# Patient Record
Sex: Female | Born: 1975
Health system: Southern US, Community
[De-identification: ages and names within clinical notes are randomized; demographics above are authoritative.]

## PROBLEM LIST (undated history)

## (undated) DIAGNOSIS — I10 Essential (primary) hypertension: Secondary | ICD-10-CM

## (undated) DIAGNOSIS — K219 Gastro-esophageal reflux disease without esophagitis: Secondary | ICD-10-CM

## (undated) DIAGNOSIS — M1990 Unspecified osteoarthritis, unspecified site: Secondary | ICD-10-CM

## (undated) HISTORY — PX: TUBAL LIGATION: SHX77

---
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
## 1999-07-04 ENCOUNTER — Encounter: Payer: Self-pay | Admitting: Family Medicine

## 1999-07-04 ENCOUNTER — Encounter: Admission: RE | Admit: 1999-07-04 | Discharge: 1999-07-04 | Payer: Self-pay | Admitting: Family Medicine

## 2000-11-04 ENCOUNTER — Ambulatory Visit (HOSPITAL_COMMUNITY): Admission: RE | Admit: 2000-11-04 | Discharge: 2000-11-04 | Payer: Self-pay | Admitting: Internal Medicine

## 2000-11-04 ENCOUNTER — Encounter: Payer: Self-pay | Admitting: Internal Medicine

## 2000-12-04 ENCOUNTER — Ambulatory Visit (HOSPITAL_COMMUNITY): Admission: RE | Admit: 2000-12-04 | Discharge: 2000-12-04 | Payer: Self-pay | Admitting: Internal Medicine

## 2000-12-04 ENCOUNTER — Encounter: Payer: Self-pay | Admitting: Internal Medicine

## 2005-06-16 ENCOUNTER — Inpatient Hospital Stay (HOSPITAL_COMMUNITY): Admission: AD | Admit: 2005-06-16 | Discharge: 2005-06-20 | Payer: Self-pay | Admitting: Obstetrics and Gynecology

## 2005-08-05 ENCOUNTER — Inpatient Hospital Stay (HOSPITAL_COMMUNITY): Admission: RE | Admit: 2005-08-05 | Discharge: 2005-08-08 | Payer: Self-pay | Admitting: Obstetrics and Gynecology

## 2005-10-26 ENCOUNTER — Emergency Department (HOSPITAL_COMMUNITY): Admission: EM | Admit: 2005-10-26 | Discharge: 2005-10-26 | Payer: Self-pay | Admitting: Emergency Medicine

## 2007-01-02 ENCOUNTER — Inpatient Hospital Stay (HOSPITAL_COMMUNITY): Admission: AD | Admit: 2007-01-02 | Discharge: 2007-01-02 | Payer: Self-pay | Admitting: Obstetrics and Gynecology

## 2007-01-14 ENCOUNTER — Inpatient Hospital Stay (HOSPITAL_COMMUNITY): Admission: AD | Admit: 2007-01-14 | Discharge: 2007-01-14 | Payer: Self-pay | Admitting: Obstetrics and Gynecology

## 2007-01-18 ENCOUNTER — Encounter (INDEPENDENT_AMBULATORY_CARE_PROVIDER_SITE_OTHER): Payer: Self-pay | Admitting: Obstetrics and Gynecology

## 2007-01-18 ENCOUNTER — Inpatient Hospital Stay (HOSPITAL_COMMUNITY): Admission: AD | Admit: 2007-01-18 | Discharge: 2007-01-22 | Payer: Self-pay | Admitting: Obstetrics and Gynecology

## 2007-03-01 HISTORY — PX: CHOLECYSTECTOMY: SHX55

## 2007-08-02 ENCOUNTER — Ambulatory Visit: Payer: Self-pay | Admitting: Cardiology

## 2007-08-10 ENCOUNTER — Ambulatory Visit: Payer: Self-pay | Admitting: Cardiology

## 2007-08-11 ENCOUNTER — Ambulatory Visit: Payer: Self-pay | Admitting: Cardiology

## 2008-04-23 ENCOUNTER — Emergency Department (HOSPITAL_COMMUNITY): Admission: EM | Admit: 2008-04-23 | Discharge: 2008-04-23 | Payer: Self-pay | Admitting: Emergency Medicine

## 2009-03-20 ENCOUNTER — Emergency Department (HOSPITAL_COMMUNITY): Admission: EM | Admit: 2009-03-20 | Discharge: 2009-03-20 | Payer: Self-pay | Admitting: Emergency Medicine

## 2009-12-28 ENCOUNTER — Observation Stay (HOSPITAL_COMMUNITY): Admission: AD | Admit: 2009-12-28 | Discharge: 2009-12-30 | Payer: Self-pay | Admitting: Obstetrics and Gynecology

## 2010-01-20 ENCOUNTER — Ambulatory Visit: Payer: Self-pay | Admitting: Nurse Practitioner

## 2010-01-20 ENCOUNTER — Inpatient Hospital Stay (HOSPITAL_COMMUNITY): Admission: AD | Admit: 2010-01-20 | Discharge: 2010-01-24 | Payer: Self-pay | Admitting: Obstetrics and Gynecology

## 2010-01-21 ENCOUNTER — Encounter (INDEPENDENT_AMBULATORY_CARE_PROVIDER_SITE_OTHER): Payer: Self-pay | Admitting: Obstetrics and Gynecology

## 2010-09-14 LAB — BASIC METABOLIC PANEL
CO2: 25 mEq/L (ref 19–32)
Calcium: 8.3 mg/dL — ABNORMAL LOW (ref 8.4–10.5)
Creatinine, Ser: 0.57 mg/dL (ref 0.4–1.2)
GFR calc Af Amer: >60 mL/min (ref >60)
Sodium: 137 mEq/L (ref 135–145)

## 2010-09-14 LAB — CBC
HCT: 30.7 % — ABNORMAL LOW (ref 36.0–46.0)
Hemoglobin: 10.1 g/dL — ABNORMAL LOW (ref 12.0–15.0)
Hemoglobin: 8.5 g/dL — ABNORMAL LOW (ref 12.0–15.0)
MCH: 26.5 pg (ref 26.0–34.0)
MCHC: 32.8 g/dL (ref 30.0–36.0)
MCV: 80.7 fL (ref 78.0–100.0)
Platelets: 269 10*3/uL (ref 150–400)
Platelets: 360 10*3/uL (ref 150–400)
RBC: 3.03 MIL/uL — ABNORMAL LOW (ref 3.87–5.11)
RBC: 3.8 MIL/uL — ABNORMAL LOW (ref 3.87–5.11)
RDW: 17.4 % — ABNORMAL HIGH (ref 11.5–15.5)
WBC: 7.5 10*3/uL (ref 4.0–10.5)
WBC: 7.8 10*3/uL (ref 4.0–10.5)

## 2010-09-14 LAB — TYPE AND SCREEN
ABO/RH(D): O POS
Antibody Screen: NEGATIVE

## 2010-09-14 LAB — RPR: RPR Ser Ql: NONREACTIVE

## 2010-09-15 LAB — CBC
HCT: 28.8 % — ABNORMAL LOW (ref 36.0–46.0)
Hemoglobin: 9.9 g/dL — ABNORMAL LOW (ref 12.0–15.0)
MCHC: 34.2 g/dL (ref 30.0–36.0)
MCV: 81.6 fL (ref 78.0–100.0)
Platelets: 366 10*3/uL (ref 150–400)
RBC: 3.13 MIL/uL — ABNORMAL LOW (ref 3.87–5.11)
RBC: 3.52 MIL/uL — ABNORMAL LOW (ref 3.87–5.11)
RDW: 16.3 % — ABNORMAL HIGH (ref 11.5–15.5)
WBC: 7.3 10*3/uL (ref 4.0–10.5)
WBC: 7.9 10*3/uL (ref 4.0–10.5)

## 2010-09-15 LAB — TYPE AND SCREEN
ABO/RH(D): O POS
Antibody Screen: NEGATIVE

## 2010-09-15 LAB — ABO/RH: ABO/RH(D): O POS

## 2010-10-04 LAB — CBC
HCT: 36.7 % (ref 36.0–46.0)
MCHC: 34.2 g/dL (ref 30.0–36.0)
MCV: 88.6 fL (ref 78.0–100.0)
Platelets: 279 10*3/uL (ref 150–400)
RBC: 4.14 MIL/uL (ref 3.87–5.11)
WBC: 7.5 10*3/uL (ref 4.0–10.5)

## 2010-10-04 LAB — URINALYSIS, ROUTINE W REFLEX MICROSCOPIC
Bilirubin Urine: NEGATIVE
Nitrite: NEGATIVE
Specific Gravity, Urine: 1.014 (ref 1.005–1.030)
Urobilinogen, UA: 0.2 mg/dL (ref 0.0–1.0)
pH: 5.5 (ref 5.0–8.0)

## 2010-10-04 LAB — COMPREHENSIVE METABOLIC PANEL
BUN: 5 mg/dL — ABNORMAL LOW (ref 6–23)
CO2: 26 mEq/L (ref 19–32)
Calcium: 8.5 mg/dL (ref 8.4–10.5)
Chloride: 105 mEq/L (ref 96–112)
Creatinine, Ser: 0.61 mg/dL (ref 0.4–1.2)
GFR calc non Af Amer: >60 mL/min (ref >60)
Total Bilirubin: 0.7 mg/dL (ref 0.3–1.2)

## 2010-10-04 LAB — LIPASE, BLOOD: Lipase: 14 U/L (ref 11–59)

## 2010-10-04 LAB — DIFFERENTIAL
Basophils Absolute: 0 10*3/uL (ref 0.0–0.1)
Lymphocytes Relative: 10 % — ABNORMAL LOW (ref 12–46)
Neutro Abs: 6.3 10*3/uL (ref 1.7–7.7)

## 2010-10-04 LAB — URINE CULTURE

## 2010-11-12 NOTE — Op Note (Signed)
[name], [name]               ACCOUNT NO.:  [id number]   MEDICAL RECORD NO.:  [id number]          PATIENT TYPE:  INP   LOCATION:  9198                          FACILITY:  [facility]   PHYSICIAN:  [name], M.D.DATE OF BIRTH:  05/04/1976   DATE OF PROCEDURE:  01/18/2007  DATE OF DISCHARGE:                               OPERATIVE REPORT   PREOPERATIVE DIAGNOSES:  1. Intrauterine pregnancy at 36 weeks.  2. Previous cesarean section x2.  3. Abdominal pain.   POSTOPERATIVE DIAGNOSES:  1. Intrauterine pregnancy at 36 weeks.  2. Previous cesarean section x2.  3. Abdominal pain.  4. Possible placental abruption.   PROCEDURE:  Repeat low transverse cesarean section.   SURGEON:  [name], M.D.   ASSISTANT:  [name], M.D.   ANESTHESIA:  Spinal.   FINDINGS:  She had a normal uterus.  There was possibly a blood clot  that came out upon rupture of membranes.  She delivered a viable female  infant with Apgars of 7 and 9 and weighed 5 pounds 4 ounces.   SPECIMENS:  Placenta sent to pathology.   ESTIMATED BLOOD LOSS:  800 mL.   COMPLICATIONS:  None.   DESCRIPTION OF PROCEDURE:  Prior to the procedure, the patient was seen  in maternity admissions.  She was found to have significant abdominal  pain, writhing in the bed, saying that something was wrong.  The fetal  heart tracing appeared normal in the 120s-130s.  She had no vaginal  bleeding and the cervix was 1, thick, and high.  Due to her 2 previous  cesarean sections, I could not definitively say that the uterus was not  dehiscing, so we elected to proceed with a repeat cesarean section.   The patient was taken to the operating room and placed in a sitting  position.  We waited for platelet count while monitoring the baby, and  the baby did appear okay.  Platelet count came back normal, and Dr.  Jean [name] instilled spinal anesthesia.  She was then placed in the dorsal  supine position with a left lateral  tilt.  The abdomen was prepped and  draped in the usual sterile fashion and a Foley catheter inserted.   The level of her anesthesia was found to be adequate, and the abdomen  was entered through a standard Pfannenstiel incision through her  previous scar.  The disposable self-retaining retractor was placed, and  the lower uterine segment was well-exposed.  There was no evidence of  uterine dehiscence and no blood in the abdomen.  The lower uterine  segment was fairly thin.  A 4-cm transverse incision was made in the  lower uterine segment, pushing the bladder inferior.  Once the uterine  cavity was entered, the incision was extended bilaterally digitally.  A  small clot did come out when the membranes were ruptured, possibly  consistent with an abruption.  The fetal vertex was grasped and brought  to the incision.  However, it was difficult to deliver.  A vacuum was  applied, and the vertex was delivered.  The mouth and nares were  then  suctioned.  The shoulders were then delivered with some difficulty.  The  remainder of the infant then delivered.  The cord was doubly clamped and  cut and the infant handed to the awaiting pediatric team.  Cord blood  and cord gas were obtained.  The placenta delivered manually and was  sent to pathology for possible abruption.  The uterus was wiped dry with  a clear lap pad and all clots and debris removed.  The uterine incision  was inspected and found to be free of extensions.  There was brisk  bleeding from the right uterine artery.  The incision was closed in 2  layers, the first layer being a running locking layer with #1 chromic  and the second layer being an imbricating layer which was also locking  starting from the right side and going to the left due to bleeding from  the right angle.  Bleeding from sinuses was controlled with 3-0 Vicryl.  Bleeding from serosal edges was controlled with electrocautery.  The  tubes and ovaries were inspected  and found to be normal.  The uterine  incision was again inspected and found to be hemostatic.  The disposable  self-retaining retractor was removed as well as a lap pad that had been  placed on the right side to pack her bowels out of the incision.  The  subfascial space was irrigated and made hemostatic with electrocautery.  The fascia was closed in a running fashion starting at both ends and  meeting in the middle with 0 Vicryl.  The subcutaneous tissue was then  irrigated and made hemostatic with electrocautery.  It was then closed  with 2-0 plain gut suture.  The skin edges were freed, and the skin  incision was closed with staples followed by a sterile dressing.   The patient tolerated the procedure well and was taken to the recovery  room in stable condition.  Counts were correct, and she received Ancef 1  g IV after cord clamp.      [name], M.D.  Electronically Signed     TDM/MEDQ  D:  01/18/2007  T:  01/18/2007  Job:  [id number]

## 2010-11-12 NOTE — Discharge Summary (Signed)
[name], [name]               ACCOUNT NO.:  [id number]   MEDICAL RECORD NO.:  [id number]          PATIENT TYPE:  INP   LOCATION:  9109                          [facility]ACILITY:  [facility]   PHYSICIAN:  Huel Cote, M.D. DATE OF BIRTH:  Nov 22, 1975   DATE OF ADMISSION:  01/18/2007  DATE OF DISCHARGE:  01/22/2007                               DISCHARGE SUMMARY   DISCHARGE DIAGNOSES:  1. Preterm pregnancy at 36 weeks delivered.  2. Status post repeat low transverse C-section for significant pain.  3. Chronic hypertension.   DISCHARGE MEDICATIONS:  1. Labetalol 100 mg p.o. b.i.d.  2. Motrin 600 mg p.o. every 6 hours p.r.n.  3. Percocet 1-2 tablets p.o. as needed.   DISCHARGE FOLLOWUP:  The patient is to follow up in the office in 3 days  for staple removal and blood pressure check.   HOSPITAL COURSE:  The patient is a 35 year old G3, P 2-0-0-2 who came in  to the hospital on January 18, 2007, at 36 plus weeks gestation complaining  of increasing abdominal pain and contractions, she was significantly  uncomfortable.  The fetal heart rate was stable and she was observed  with no cervical dilation.  However, the patient continued to have  increasing pain and felt that she was having tearing at her uterine area  which was concerning for a possibility of a uterine scar dehiscence.  Therefore, the decision was made to proceed with cesarean section.  Her  prenatal care had been complicated by chronic hypertension and this was  controlled with labetalol.  She had a history of preeclampsia with her  second child and was delivered at 37 weeks for that pregnancy.   PRENATAL LABORATORIES:  Prenatal labs are as follows:  O+, RPR  nonreactive, rubella immune, hepatitis B surface antigen negative, HIV  negative, GC negative, chlamydia negative, one-hour Glucola 123.   PAST OBSTETRICAL HISTORY:  Previous C-section x2, both 7 pounds 9  ounces.  First baby was breech, second baby was a repeat for the  patient's choice with preeclampsia.   PAST MEDICAL HISTORY:  Borderline hypertension, asthma and reflux  disease.   PAST SURGICAL HISTORY:  1. C-section times two.  2. Cholecystectomy.  3. Tonsils and adenoids.   ALLERGIES:  She had no known drug allergies.   MEDICATIONS ON ADMISSION:  1. Protonix.  2. Labetalol.   CLINICAL COURSE:  Medications on arrival were as stated.  She was in  severe distress stating that she was having severe abdominal pain and  was tender at her incisional area.  She then underwent a repeat low  transverse C-section performed by Dr. Lavina Hamman as primary and  myself, Dr. Senaida Ores, as the assist.  At the time of surgery, we  carefully examined her uterus, and there was no evidence of any scar  dehiscence.  It appeared completely intact.  We did send her placenta  which have possible small adherent clot.  There was no evidence of gross  rupture, however.  After sent to pathology, she did have increased  fibrous plaques and the largest was approximately 15% of the placental  tissue, though it is possible that the patient had a small abruption.  She was delivered of a vigorous female infant, weight was 5 pounds 4  ounces, Apgars were 8 and 9.  Cord pH was normal at 7.24.  She was then  admitted for routine postoperative care.  She did quite well.  On postop  day #3, she did have an incident of slightly increased blood pressures  off her labetalol, therefore, it was restarted.  She had a headache and  pre-eclamptic labs were repeated and normal.  Her headache resolved  spontaneously with medications, and by postop day #4 she was feeling  quite well.  She was tolerating a regular diet.  Her blood pressures  were acceptable at 130/70-150/90.  With her labetalol restarted, it was  felt that she would do better with staple removal in the office in  approximately 3 days, and this was scheduled as a follow-up appointment      Huel Cote, M.D.   Electronically Signed     KR/MEDQ  D:  01/22/2007  T:  01/22/2007  Job:  [id number]

## 2010-11-12 NOTE — Assessment & Plan Note (Signed)
[name]                          [name] NOTE   NAME:[name] [name]                      MRN:          [id number]  DATE:08/02/2007                            DOB:          1976/03/15    REFERRING PHYSICIAN:  Doreen Beam, MD   PRIMARY CARDIOLOGIST:  Dr. Willa Rough (new).   REASON FOR CONSULTATION:  Ms. [name] is a very pleasant 35 year old  female, with no known history of heart disease, now referred for  evaluation of progressive chest pain, exertional dyspnea, and tachy  palpitations.   The patient has had prior workup consisting of a 2-D echocardiogram in  2007, for evaluation of murmur and palpitations, which was reviewed by  Dr. Andee Lineman and was entirely within normal limits.  She has not had any  formal stress testing.  She was also seen here on one previous occasion,  by Dr. Antoine Poche in 2003, who found her symptoms to be atypical.  He did  not order any diagnostic testing, but suggested risk factor  modification.   [name]'s cardiac risk factors are notable only for hypertension.  She has been on labetalol for the past 2 years, following the birth of  her second of three children.   The patient's symptoms are completely unpredictable in onset, with no  strict correlation with exertion.  She does point out that these have  been previously ascribed to anxiety disorder, but she is concerned that  there is currently a progressive nature to the symptoms.  Of note, she  was recently referred for a CT angiogram of the chest a few weeks ago,  which was negative for pulmonary embolus.  However, there was mention of  early coronary artery calcification.   Cardiogram in our [name] today reveals normal sinus rhythm at 73 bpm  with normal axis and no ischemic changes.   ALLERGIES:  No known drug allergies.   CURRENT MEDICATIONS:  Pindolol 15 dB.  Nexium.  Xanax.   HISTORY:  Hypertension.   SURGICAL HISTORY:  C-section,  cholecystectomy.   SOCIAL HISTORY:  The patient had been living with her fiance for 12  years.  They have 3 children together.  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  She has never smoked  tobacco.  Denies alcohol use.   FAMILY HISTORY:  Negative for myocardial infarction or coronary artery  disease.   REVIEW OF SYSTEMS:  Denies history of diabetes mellitus, hyperlipidemia,  or thyroid disorder.   PAST MEDICAL HISTORY:  1. Gastroesophageal reflux disease.  2. Morbid obesity.   The patient reports persistent lower extremity edema over the past  several years.  She has some exertional dyspnea, but otherwise  nonexertional chest discomfort and frequent palpitations.  She states  that her chest pain occurs essentially on a daily basis.  She also  states that she wore a 24-hour Holter monitor at the age of 78, and was  even temporally treated with medication.  However, she was never  referred to a pediatric cardiologist for formal evaluation.  She reports  being  diagnosed with a SVT  approximately 2 years ago, when she  presented to the emergency room at [name], but was not  hospitalized.  Otherwise as per HPI, remaining systems negative.   PHYSICAL EXAMINATION:  Blood pressure 137/89, pulse 90, regular weight  239.4.  GENERAL:  35 year old female, morbidly obese, sitting upright in no  distress.  HEENT:  Normocephalic, atraumatic.  NECK:  Palpable bilateral carotid pulses without bruits; unable to  assess JVD, secondary to neck girth.  LUNGS:  Clear to auscultation all fields.  HEART:  Regular rate and rhythm (S1, S2) a soft grade 1/6 holosystolic  murmur at the base.  No rubs.  ABDOMEN:  Protuberant, nontender with intact bowel sounds.  Extremities of 3+ bilateral, nonpitting edema.  Minimally palpable  dorsalis pedis pulses.  NEURO:  No focal deficit.   IMPRESSION:  1. Atypical chest pain.      a.     Recent CT angiogram of  the chest negative for pulmonary       emboli, but suggestive of early coronary artery calcifications.  2. Longstanding tachy palpitations.  3. Exertional dyspnea.      a.     Normal 2-D echocardiogram in 2007.  4. Chronic lower extremity edema.  5. Morbid obesity.  6. Hypertension.  7. Gastroesophageal reflux disease.   PLAN:  Following review with Dr. Willa Rough, recommendations are as  follows:  1. Baseline 2-D echocardiogram.  2. Adenosine stress Cardiolite for risk stratification, in light of      recent chest angiogram result suggestive of early coronary artery      calcifications.  3. CardioNet monitoring for further evaluation of longstanding tachy      palpitations, and rule out of underlying dysrhythmias.  4. Extensive blood work with complete metabolic profile, CBC, TSH, and      fasting lipid profile.  5. Schedule return clinic follow-up with myself and Dr. Myrtis Ser in 1      month, for review of study results and further recommendations.  Of      note, we will also review her medication profile at that time, with      question of whether or not to continue labetalol versus selecting a      more cardioselective of beta blocker, such as Toprol.      Rozell Searing, PA-C  Electronically [name]      Luis Abed, MD, [facility]  Electronically [name]   GS/MedQ  DD: 08/02/2007  DT: 08/02/2007  Job #: [id number]   cc:   [facility] Beam, MD

## 2010-11-15 NOTE — Op Note (Signed)
[name], [name]               ACCOUNT NO.:  [id number]   MEDICAL RECORD NO.:  [id number]          PATIENT TYPE:  INP   LOCATION:  9119                          FACILITY[facility]  [facility]   PHYSICIAN:  Lenoard Aden, M.D.DATE OF BIRTH:  09/28/1975   DATE OF PROCEDURE:  08/05/2005  DATE OF DISCHARGE:                                 OPERATIVE REPORT   PREOPERATIVE DIAGNOSES:  1.  A 37 week intrauterine pregnancy.  2.  Repeat C-section.  3.  Pregnancy-induced hypertension.   POSTOPERATIVE DIAGNOSES:  1.  A 37 week intrauterine pregnancy.  2.  Repeat C-section.  3.  Pregnancy-induced hypertension.   PROCEDURE:  Repeat low segment transverse cesarean section.   SURGEON:  Dr. Billy Coast   ASSISTANT:  Fredric Mare   ANESTHESIA:  Spinal.   ESTIMATED BLOOD LOSS:  1000 mL.   COMPLICATIONS:  None.   DRAINS:  Foley.   COUNTS:  Correct.  The patient went to recovery in good condition.   BRIEF OPERATIVE NOTE:  After being apprised of the risks of anesthesia,  infection, bleeding, injury to abdominal organs, need for repair, delayed  versus immediate complications to include bowel and bladder injury, the  patient is brought to the operating room where she is administered a spinal  anesthetic without complications, prepped and draped in the usual sterile  fashion, a Foley catheter placed.  After achieving adequate anesthesia,  dilute Marcaine solution placed, Pfannenstiel skin incision made with the  scalpel, carried down to the fascia which is nicked in the midline and  opened transversely using Mayo scissors.  Rectus muscle is dissected sharply  in the midline, peritoneum entered sharply, the bladder blade placed.  Visceroperitoneum is scored in a smile-like fashion, dissected sharply off  the lower uterine segment.  Sharl Ma hysterotomy incision made, atraumatically  delivery, full-term, living female, handed to pediatricians in attendance,  Apgars 8 and 9, cord blood collected, placenta delivered  manually intact,  uterus exteriorized, curetted using a dry lap pack and closed in 2 layers  using a 0 Monocryl suture in a continuous running fashion.  Good hemostasis  is noted, irrigation accomplished, bladder flap inspected and  found to be hemostatic.  Fascia then closed using a 0 Monocryl in a  continuous running fashion.  Skin closed using staples.  Subcutaneous tissue  previously closed using a 0 plain suture.  The patient tolerates the  procedure well and is transferred to recovery in good condition.      Lenoard Aden, M.D.  Electronically Signed     RJT/MEDQ  D:  08/05/2005  T:  08/05/2005  Job:  [id number]

## 2010-11-15 NOTE — H&P (Signed)
[name], [name]               ACCOUNT NO.:  [id number]   MEDICAL RECORD NO.:  [id number]          PATIENT TYPE:  INP   LOCATION:  9152                          FACILITY:  [facility]   PHYSICIAN:  Lenoard Aden, M.D.DATE OF BIRTH:  Mar 04, 1976   DATE OF ADMISSION:  06/16/2005  DATE OF DISCHARGE:                                HISTORY & PHYSICAL   CHIEF COMPLAINT:  Rule out preeclampsia.   HISTORY OF PRESENT ILLNESS:  She is a 35 year old white female, G2, P80, EDD  of August 28, 2005 at 6 and 4/7ths weeks gestation with elevated liver  function tests and hypertension.   ALLERGIES:  No known drug allergies.   MEDICATIONS:  1.  Prenatal vitamins.  2.  Labetalol 100 mg p.o. b.i.d.   PAST MEDICAL HISTORY:  1.  She has a history of primary cesarean section in 2000 of a 7 pound, 9      ounce female without complications.  She has a history of pregnancy-      induced hypertension with that pregnancy.  2.  She has a history of irritable bowel syndrome.   FAMILY HISTORY:  She has a family history of lung cancer, insulin-dependent  diabetes, stroke and coronary vascular disease.   PRENATAL LABORATORY DATA:  Blood type O positive.  Rubella immune.  Hepatitis and HIV nonreactive.  GC and Chlamydia are negative.  AFP normal.  Ultrascreen normal.   PHYSICAL EXAMINATION:  GENERAL:  She is a well-developed, well-nourished,  white female in no acute distress.  HEENT:  Normal.  LUNGS:  Clear.  HEART:  Regular rhythm.  ABDOMEN:  Soft, gravid, nontender.  No right upper quadrant masses noted.  CERVICAL EXAM:  Deferred.  EXTREMITIES:  DTRs 2+.  No evidence of clonus.  NEUROLOGIC:  Nonfocal.   IMPRESSION:  1.  A 29 and 4/7ths week intrauterine pregnancy.  2.  Chronic hypertension versus pregnancy-induced hypertension, rule out      superimposed preeclampsia with elevated liver function tests.  A normal      platelet count today.   PLAN:  The plan is to admit.  Administer betamethasone 12.5  mg IM; repeat in  12 hours.  Magnesium sulfate prophylaxis as needed.  Repeat cesarean section  as indicated.  Anticipate expectant management versus possible delivery.      Lenoard Aden, M.D.  Electronically Signed     RJT/MEDQ  D:  06/16/2005  T:  06/16/2005  Job:  [id number]

## 2010-11-15 NOTE — Discharge Summary (Signed)
[name], [name]               ACCOUNT NO.:  [id number]   MEDICAL RECORD NO.:  [id number]          PATIENT TYPE:  INP   LOCATION:  9152                          [facility]   PHYSICIAN:  Lenoard Aden, M.D.DATE OF BIRTH:  09-26-1975   DATE OF ADMISSION:  06/16/2005  DATE OF DISCHARGE:  06/20/2005                                 DISCHARGE SUMMARY   The patient admitted for hypertensive exacerbation at 29 weeks.  Placed on  bed rest and labs normalized.  A 24-hour urine normalized.  Discharge to  home on hospital day #5.  Discharge teaching done.  Hypertensive precautions  given.   FOLLOW UP:  In the office in one week.  Precautions given.      Lenoard Aden, M.D.  Electronically Signed     RJT/MEDQ  D:  07/08/2005  T:  07/08/2005  Job:  [id number]

## 2010-11-15 NOTE — Discharge Summary (Signed)
[name], [name]               ACCOUNT NO.:  [id number]   MEDICAL RECORD NO.:  [id number]          PATIENT TYPE:  INP   LOCATION:  9119                          [facility]   PHYSICIAN:  Lenoard Aden, M.D.DATE OF BIRTH:  10/22/1975   DATE OF ADMISSION:  08/06/2005  DATE OF DISCHARGE:  08/08/2005                                 DISCHARGE SUMMARY   The patient underwent uncomplicated repeat cesarean section on August 06, 2005.  Postoperative course uncomplicated.  Tolerated a regular diet well.  Discharged to home on day 3.  Prenatal vitamins, iron and Percocet given.  Follow up in the office in 4-6 weeks.  Discharge teaching done.      Lenoard Aden, M.D.  Electronically Signed     RJT/MEDQ  D:  09/03/2005  T:  09/03/2005  Job:  [id number]

## 2011-04-01 LAB — URINE CULTURE

## 2011-04-01 LAB — DIFFERENTIAL
Basophils Absolute: 0
Basophils Relative: 0
Eosinophils Absolute: 0.1
Lymphs Abs: 2.4
Neutrophils Relative %: 67

## 2011-04-01 LAB — CBC
MCHC: 32.7
MCV: 81.1
Platelets: 293
WBC: 8.8

## 2011-04-01 LAB — PREGNANCY, URINE: Preg Test, Ur: NEGATIVE

## 2011-04-01 LAB — URINALYSIS, ROUTINE W REFLEX MICROSCOPIC
Bilirubin Urine: NEGATIVE
Nitrite: NEGATIVE
Specific Gravity, Urine: 1.009
Urobilinogen, UA: 0.2

## 2011-04-01 LAB — URINE MICROSCOPIC-ADD ON

## 2011-04-01 LAB — BASIC METABOLIC PANEL
BUN: 9
Chloride: 106
Creatinine, Ser: 0.68

## 2011-04-01 LAB — WET PREP, GENITAL
Trich, Wet Prep: NONE SEEN
Yeast Wet Prep HPF POC: NONE SEEN

## 2011-04-01 LAB — RPR: RPR Ser Ql: NONREACTIVE

## 2011-04-14 LAB — LACTATE DEHYDROGENASE
LDH: 102
LDH: 140

## 2011-04-14 LAB — CBC
Hemoglobin: 12.1
Hemoglobin: 8.6 — ABNORMAL LOW
Hemoglobin: 9.6 — ABNORMAL LOW
MCHC: 33.6
MCHC: 33.7
MCV: 83.8
Platelets: 270
Platelets: 373
RBC: 3.37 — ABNORMAL LOW
RBC: 4.3
RDW: 15.1 — ABNORMAL HIGH
RDW: 15.2 — ABNORMAL HIGH
RDW: 15.7 — ABNORMAL HIGH
WBC: 6.7

## 2011-04-14 LAB — COMPREHENSIVE METABOLIC PANEL
ALT: 14
AST: 24
Alkaline Phosphatase: 106
Alkaline Phosphatase: 150 — ABNORMAL HIGH
BUN: 5 — ABNORMAL LOW
CO2: 27
Calcium: 8.9
Chloride: 104
GFR calc Af Amer: >60
GFR calc non Af Amer: >60
GFR calc non Af Amer: >60
Glucose, Bld: 75
Glucose, Bld: 85
Potassium: 4.1
Sodium: 139
Total Bilirubin: 0.3
Total Protein: 6.3

## 2011-04-14 LAB — SAMPLE TO BLOOD BANK

## 2011-04-14 LAB — URINALYSIS, ROUTINE W REFLEX MICROSCOPIC
Bilirubin Urine: NEGATIVE
Glucose, UA: NEGATIVE
Hgb urine dipstick: NEGATIVE
Protein, ur: NEGATIVE
Urobilinogen, UA: 0.2

## 2011-04-14 LAB — RPR: RPR Ser Ql: NONREACTIVE

## 2011-04-14 LAB — URIC ACID: Uric Acid, Serum: 6.4

## 2011-04-15 LAB — COMPREHENSIVE METABOLIC PANEL
AST: 16
Albumin: 2.4 — ABNORMAL LOW
BUN: 5 — ABNORMAL LOW
CO2: 21
Calcium: 8.6
Chloride: 106
Creatinine, Ser: 0.58
GFR calc Af Amer: >60
GFR calc non Af Amer: >60
Total Bilirubin: 0.3

## 2011-04-15 LAB — URINALYSIS, DIPSTICK ONLY
Bilirubin Urine: NEGATIVE
Nitrite: NEGATIVE
Specific Gravity, Urine: >1.03 — ABNORMAL HIGH
Urobilinogen, UA: 0.2
pH: 5.5

## 2011-04-15 LAB — CBC
HCT: 34.5 — ABNORMAL LOW
MCHC: 33.5
MCV: 83.8
Platelets: 408 — ABNORMAL HIGH
WBC: 9.7

## 2011-04-15 LAB — URIC ACID: Uric Acid, Serum: 6.8

## 2012-04-20 ENCOUNTER — Emergency Department (HOSPITAL_COMMUNITY)
Admission: EM | Admit: 2012-04-20 | Discharge: 2012-04-20 | Disposition: A | Discharge status: Home / Self Care | Payer: 59 | Attending: Emergency Medicine | Admitting: Emergency Medicine

## 2012-04-20 ENCOUNTER — Encounter (HOSPITAL_COMMUNITY): Payer: Self-pay | Admitting: *Deleted

## 2012-04-20 ENCOUNTER — Emergency Department (HOSPITAL_COMMUNITY): Payer: 59

## 2012-04-20 ENCOUNTER — Other Ambulatory Visit: Payer: Self-pay

## 2012-04-20 DIAGNOSIS — I10 Essential (primary) hypertension: Secondary | ICD-10-CM | POA: Insufficient documentation

## 2012-04-20 DIAGNOSIS — R079 Chest pain, unspecified: Secondary | ICD-10-CM | POA: Insufficient documentation

## 2012-04-20 DIAGNOSIS — K219 Gastro-esophageal reflux disease without esophagitis: Secondary | ICD-10-CM | POA: Insufficient documentation

## 2012-04-20 DIAGNOSIS — Z79899 Other long term (current) drug therapy: Secondary | ICD-10-CM | POA: Insufficient documentation

## 2012-04-20 DIAGNOSIS — R002 Palpitations: Secondary | ICD-10-CM | POA: Insufficient documentation

## 2012-04-20 HISTORY — DX: Essential (primary) hypertension: I10

## 2012-04-20 HISTORY — DX: Gastro-esophageal reflux disease without esophagitis: K21.9

## 2012-04-20 LAB — CBC
Hemoglobin: 11.7 g/dL — ABNORMAL LOW (ref 12.0–15.0)
Platelets: 331 10*3/uL (ref 150–400)
RBC: 4.4 MIL/uL (ref 3.87–5.11)
WBC: 8 10*3/uL (ref 4.0–10.5)

## 2012-04-20 LAB — POCT I-STAT, CHEM 8
BUN: 9 mg/dL (ref 6–23)
Calcium, Ion: 1.2 mmol/L (ref 1.12–1.23)
Creatinine, Ser: 0.8 mg/dL (ref 0.50–1.10)
TCO2: 24 mmol/L (ref 0–100)

## 2012-04-20 NOTE — ED Notes (Signed)
Pt reports palpitations last night d/t anxiety, reports taking xanax.  Pt reports normally when this happens, it would only last for 30 minutes.  But this time it never stopped.  Pt also reports mid sternal cp, R eye twitching, lips tingling and mild SOB.

## 2012-04-20 NOTE — ED Provider Notes (Signed)
History     CSN: [id number]  Arrival date & time 04/20/12  1016   First MD Initiated Contact with Patient 04/20/12 1031      Chief Complaint  Patient presents with  . Chest Pain  . Palpitations    (Consider location/radiation/quality/duration/timing/severity/associated sxs/prior treatment) HPI.... palpitations feeling like  skipped beats and intermittent chest pain described as a tightness since last night.  No dyspnea, diaphoresis, nausea.   mother had a fatal MI at age 68.  No history of diabetes, hypertension, hypercholesterolemia.  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  Nothing makes symptoms better or worse. Severity is mild.  No radiation.  Nonsmoker. Does not drink excessive amounts of caffeinated beverages  Past Medical History  Diagnosis Date  . Hypertension   . GERD (gastroesophageal reflux disease)     Past Surgical History  Procedure Date  . Cesarean section   . Cholecystectomy     No family history on file.  History  Substance Use Topics  . Smoking status: Never Smoker   . Smokeless tobacco: Not on file  . Alcohol Use: Yes     occa    OB History    Grav Para Term Preterm Abortions TAB SAB Ect Mult Living                  Review of Systems  All other systems reviewed and are negative.    Allergies  Review of patient's allergies indicates no known allergies.  Home Medications   Current Outpatient Rx  Name Route Sig Dispense Refill  . ACETAMINOPHEN 500 MG PO TABS Oral Take 1,000 mg by mouth every 6 (six) hours as needed. Pain    . ALPRAZOLAM 0.5 MG PO TABS Oral Take 0.5 mg by mouth at bedtime as needed. Anxiety    . LISINOPRIL-HYDROCHLOROTHIAZIDE 10-12.5 MG PO TABS Oral Take 1 tablet by mouth daily.    . ADULT MULTIVITAMIN W/MINERALS CH Oral Take 1 tablet by mouth daily.    Marland Kitchen OMEPRAZOLE 40 MG PO CPDR Oral Take 40 mg by mouth daily.    Marland Kitchen VITAMIN D (ERGOCALCIFEROL) 50000 UNITS PO CAPS Oral Take 50,000 Units by mouth every 7 (seven)  days. Takes on Mondays      BP 126/67  Pulse 88  Temp 98.1 F (36.7 C) (Oral)  Resp 21  SpO2 98%  LMP 03/31/2012  Physical Exam  Nursing note and vitals reviewed. Constitutional: She is oriented to person, place, and time. She appears well-developed and well-nourished.  HENT:  Head: Normocephalic and atraumatic.  Eyes: Conjunctivae normal and EOM are normal. Pupils are equal, round, and reactive to light.  Neck: Normal range of motion. Neck supple.  Cardiovascular: Normal rate, regular rhythm and normal heart sounds.   Pulmonary/Chest: Effort normal and breath sounds normal.  Abdominal: Soft. Bowel sounds are normal.  Musculoskeletal: Normal range of motion.  Neurological: She is alert and oriented to person, place, and time.  Skin: Skin is warm and dry.  Psychiatric: She has a normal mood and affect.    ED Course  Procedures (including critical care time)  Labs Reviewed  CBC - Abnormal; Notable for the following:    Hemoglobin 11.7 (*)     HCT 35.7 (*)     All other components within normal limits  POCT I-STAT, CHEM 8 - Abnormal; Notable for the following:    Potassium 3.4 (*)     All other components within normal limits  POCT I-STAT TROPONIN I  Dg Chest 2 View  04/20/2012  *RADIOLOGY REPORT*  Clinical Data: Left-sided chest pain  CHEST - 2 VIEW  Comparison: none  Findings: Normal mediastinum and cardiac silhouette.  Normal pulmonary  vasculature.  No evidence of effusion, infiltrate, or pneumothorax.  No acute bony abnormality.  IMPRESSION: No acute cardiopulmonary process.   Original Report Authenticated By: Genevive Bi, M.D.      No diagnosis found.   Date: 04/20/2012  Rate: 92  Rhythm: normal sinus rhythm  QRS Axis: normal  Intervals: normal  ST/T Wave abnormalities: normal  Conduction Disutrbances: none  Narrative Interpretation: unremarkable     MDM  Patient looks well. Screening tests show no acute changes.  Patient is low risk for pulmonary  embolus or acute coronary syndrome.  Discussed anemia and hypokalemia.  Monitor shows no obvious ectopy        Donnetta Hutching, MD 04/20/12 1402

## 2012-05-25 ENCOUNTER — Ambulatory Visit: Payer: Self-pay

## 2012-05-25 ENCOUNTER — Other Ambulatory Visit: Payer: Self-pay | Admitting: Occupational Medicine

## 2012-05-25 DIAGNOSIS — M25519 Pain in unspecified shoulder: Secondary | ICD-10-CM

## 2012-06-15 ENCOUNTER — Ambulatory Visit: Payer: PRIVATE HEALTH INSURANCE | Attending: Family Medicine | Admitting: Physical Therapy

## 2012-06-18 ENCOUNTER — Ambulatory Visit: Payer: PRIVATE HEALTH INSURANCE | Attending: Family Medicine | Admitting: Physical Therapy

## 2012-06-18 DIAGNOSIS — IMO0001 Reserved for inherently not codable concepts without codable children: Secondary | ICD-10-CM | POA: Insufficient documentation

## 2012-06-18 DIAGNOSIS — M25619 Stiffness of unspecified shoulder, not elsewhere classified: Secondary | ICD-10-CM | POA: Insufficient documentation

## 2012-06-18 DIAGNOSIS — M25519 Pain in unspecified shoulder: Secondary | ICD-10-CM | POA: Insufficient documentation

## 2012-06-21 ENCOUNTER — Ambulatory Visit: Payer: PRIVATE HEALTH INSURANCE | Attending: Family Medicine | Admitting: Physical Therapy

## 2012-06-21 DIAGNOSIS — M25519 Pain in unspecified shoulder: Secondary | ICD-10-CM | POA: Insufficient documentation

## 2012-06-21 DIAGNOSIS — M25619 Stiffness of unspecified shoulder, not elsewhere classified: Secondary | ICD-10-CM | POA: Insufficient documentation

## 2012-06-21 DIAGNOSIS — IMO0001 Reserved for inherently not codable concepts without codable children: Secondary | ICD-10-CM | POA: Insufficient documentation

## 2012-06-24 ENCOUNTER — Encounter (HOSPITAL_COMMUNITY): Payer: Self-pay | Admitting: *Deleted

## 2012-06-24 ENCOUNTER — Ambulatory Visit: Payer: PRIVATE HEALTH INSURANCE | Admitting: Physical Therapy

## 2012-06-24 ENCOUNTER — Emergency Department (HOSPITAL_COMMUNITY)
Admission: EM | Admit: 2012-06-24 | Discharge: 2012-06-24 | Disposition: A | Discharge status: Home / Self Care | Payer: 59 | Attending: Emergency Medicine | Admitting: Emergency Medicine

## 2012-06-24 DIAGNOSIS — Z3202 Encounter for pregnancy test, result negative: Secondary | ICD-10-CM | POA: Insufficient documentation

## 2012-06-24 DIAGNOSIS — Z79899 Other long term (current) drug therapy: Secondary | ICD-10-CM | POA: Insufficient documentation

## 2012-06-24 DIAGNOSIS — I10 Essential (primary) hypertension: Secondary | ICD-10-CM | POA: Insufficient documentation

## 2012-06-24 DIAGNOSIS — R112 Nausea with vomiting, unspecified: Secondary | ICD-10-CM | POA: Insufficient documentation

## 2012-06-24 DIAGNOSIS — R197 Diarrhea, unspecified: Secondary | ICD-10-CM | POA: Insufficient documentation

## 2012-06-24 DIAGNOSIS — K219 Gastro-esophageal reflux disease without esophagitis: Secondary | ICD-10-CM | POA: Insufficient documentation

## 2012-06-24 DIAGNOSIS — R059 Cough, unspecified: Secondary | ICD-10-CM | POA: Insufficient documentation

## 2012-06-24 DIAGNOSIS — R05 Cough: Secondary | ICD-10-CM | POA: Insufficient documentation

## 2012-06-24 DIAGNOSIS — R1084 Generalized abdominal pain: Secondary | ICD-10-CM | POA: Insufficient documentation

## 2012-06-24 DIAGNOSIS — B9789 Other viral agents as the cause of diseases classified elsewhere: Secondary | ICD-10-CM | POA: Insufficient documentation

## 2012-06-24 DIAGNOSIS — B349 Viral infection, unspecified: Secondary | ICD-10-CM

## 2012-06-24 DIAGNOSIS — R509 Fever, unspecified: Secondary | ICD-10-CM | POA: Insufficient documentation

## 2012-06-24 LAB — POCT I-STAT, CHEM 8
BUN: 12 mg/dL (ref 6–23)
Chloride: 105 mEq/L (ref 96–112)
Creatinine, Ser: 0.6 mg/dL (ref 0.50–1.10)
Hemoglobin: 13.6 g/dL (ref 12.0–15.0)
Potassium: 3.6 mEq/L (ref 3.5–5.1)
Sodium: 141 mEq/L (ref 135–145)

## 2012-06-24 LAB — URINALYSIS, ROUTINE W REFLEX MICROSCOPIC
Bilirubin Urine: NEGATIVE
Glucose, UA: NEGATIVE mg/dL
Nitrite: NEGATIVE
Specific Gravity, Urine: 1.03 (ref 1.005–1.030)
pH: 5 (ref 5.0–8.0)

## 2012-06-24 LAB — URINE MICROSCOPIC-ADD ON

## 2012-06-24 MED ORDER — LOPERAMIDE HCL 2 MG PO CAPS
4.0000 mg | ORAL_CAPSULE | Freq: Once | ORAL | Status: AC
  Administered 2012-06-24: 4 mg via ORAL
  Filled 2012-06-24: qty 2

## 2012-06-24 MED ORDER — ONDANSETRON HCL 4 MG/2ML IJ SOLN
4.0000 mg | Freq: Once | INTRAMUSCULAR | Status: AC
  Administered 2012-06-24: 4 mg via INTRAVENOUS
  Filled 2012-06-24: qty 2

## 2012-06-24 MED ORDER — SODIUM CHLORIDE 0.9 % IV BOLUS (SEPSIS)
1000.0000 mL | Freq: Once | INTRAVENOUS | Status: AC
  Administered 2012-06-24: 1000 mL via INTRAVENOUS

## 2012-06-24 MED ORDER — ONDANSETRON 8 MG PO TBDP: ORAL_TABLET | ORAL | Status: DC

## 2012-06-24 NOTE — ED Provider Notes (Signed)
Medical screening examination/treatment/procedure(s) were performed by non-physician practitioner and as supervising physician I was immediately available for consultation/collaboration. Devoria Albe, MD, FACEP   Ward Givens, MD 06/24/12 [id number]

## 2012-06-24 NOTE — ED Provider Notes (Signed)
History     CSN: [id number]  Arrival date & time 06/24/12  4098   First MD Initiated Contact with Patient 06/24/12 0421      Chief Complaint  Patient presents with  . Nausea vomiting and diarrhea    HPI  History provided by the patient. Patient is a 36 year old female with history of hypertension and acid reflux who presents with complaints of acute onset nausea vomiting and diarrhea. Patient reports recently having cough and cold symptoms with some sore throat. She was put on amoxicillin last Friday and through the weekend and weak today. She has been taking this without problems until she acutely developed nausea vomiting diarrhea symptoms tonight. Diarrhea is described as watery with out blood or mucus. She reports multiple episodes of both vomiting and diarrhea. Symptoms have been associated with some chills and sweats. She also reports some subjective fevers. She denies any other aggravating or alleviating factors.    Past Medical History  Diagnosis Date  . Hypertension   . GERD (gastroesophageal reflux disease)     Past Surgical History  Procedure Date  . Cesarean section   . Cholecystectomy     No family history on file.  History  Substance Use Topics  . Smoking status: Never Smoker   . Smokeless tobacco: Not on file  . Alcohol Use: Yes     Comment: occa    OB History    Grav Para Term Preterm Abortions TAB SAB Ect Mult Living                  Review of Systems  Constitutional: Positive for fever and chills.  Respiratory: Positive for cough. Negative for shortness of breath.   Cardiovascular: Negative for chest pain.  Gastrointestinal: Positive for nausea, vomiting, abdominal pain and diarrhea. Negative for constipation.  Skin: Negative for rash.  All other systems reviewed and are negative.    Allergies  Review of patient's allergies indicates no known allergies.  Home Medications   Current Outpatient Rx  Name  Route  Sig  Dispense  Refill  .  ACETAMINOPHEN 500 MG PO TABS   Oral   Take 1,000 mg by mouth every 6 (six) hours as needed. Pain         . ALPRAZOLAM 0.5 MG PO TABS   Oral   Take 0.5 mg by mouth at bedtime as needed. Anxiety         . AMOXICILLIN 500 MG PO CAPS   Oral   Take 500 mg by mouth 2 (two) times daily.         Marland Kitchen LISINOPRIL-HYDROCHLOROTHIAZIDE 10-12.5 MG PO TABS   Oral   Take 1 tablet by mouth daily.         . ADULT MULTIVITAMIN W/MINERALS CH   Oral   Take 1 tablet by mouth daily.         Marland Kitchen OMEPRAZOLE 40 MG PO CPDR   Oral   Take 40 mg by mouth daily.         Marland Kitchen VITAMIN D (ERGOCALCIFEROL) 50000 UNITS PO CAPS   Oral   Take 50,000 Units by mouth every 7 (seven) days. Takes on Mondays           BP 124/72  Pulse 111  Temp 97.7 F (36.5 C) (Oral)  Resp 18  SpO2 96%  LMP 05/30/2012  Physical Exam  Nursing note and vitals reviewed. Constitutional: She is oriented to person, place, and time. She appears well-developed and well-nourished. No distress.  HENT:  Head: Normocephalic.  Cardiovascular: Regular rhythm.  Tachycardia present.   No murmur heard. Pulmonary/Chest: Effort normal and breath sounds normal. No respiratory distress. She has no wheezes. She has no rales.  Abdominal: Soft. She exhibits no distension. There is tenderness. There is no rebound.        Mild diffuse tenderness  Neurological: She is alert and oriented to person, place, and time.  Skin: Skin is warm and dry. No rash noted.  Psychiatric: She has a normal mood and affect. Her behavior is normal.    ED Course  Procedures  Results for orders placed during the hospital encounter of 06/24/12  URINALYSIS, ROUTINE W REFLEX MICROSCOPIC      Component Value Range   Color, Urine YELLOW  YELLOW   APPearance TURBID (*) CLEAR   Specific Gravity, Urine 1.030  1.005 - 1.030   pH 5.0  5.0 - 8.0   Glucose, UA NEGATIVE  NEGATIVE mg/dL   Hgb urine dipstick SMALL (*) NEGATIVE   Bilirubin Urine NEGATIVE  NEGATIVE    Ketones, ur NEGATIVE  NEGATIVE mg/dL   Protein, ur NEGATIVE  NEGATIVE mg/dL   Urobilinogen, UA 0.2  0.0 - 1.0 mg/dL   Nitrite NEGATIVE  NEGATIVE   Leukocytes, UA NEGATIVE  NEGATIVE  POCT PREGNANCY, URINE      Component Value Range   Preg Test, Ur NEGATIVE  NEGATIVE  POCT I-STAT, CHEM 8      Component Value Range   Sodium 141  135 - 145 mEq/L   Potassium 3.6  3.5 - 5.1 mEq/L   Chloride 105  96 - 112 mEq/L   BUN 12  6 - 23 mg/dL   Creatinine, Ser 2.95  0.50 - 1.10 mg/dL   Glucose, Bld 621 (*) 70 - 99 mg/dL   Calcium, Ion 3.08  6.57 - 1.23 mmol/L   TCO2 24  0 - 100 mmol/L   Hemoglobin 13.6  12.0 - 15.0 g/dL   HCT 84.6  96.2 - 95.2 %  URINE MICROSCOPIC-ADD ON      Component Value Range   Squamous Epithelial / LPF RARE  RARE   WBC, UA 0-2  <3 WBC/hpf   RBC / HPF 0-2  <3 RBC/hpf   Bacteria, UA FEW (*) RARE   Urine-Other MUCOUS PRESENT          1. Nausea vomiting and diarrhea   2. Viral syndrome       MDM  5:20AM patient seen and evaluated. Patient does not appear in any significant discomfort. She is not appear severely ill or toxic.  Patient with symptoms of acute onset nausea vomiting diarrhea consistent with viral GI process. Unremarkable labs and urine. Plan to hydrate and provide antibiotics.        Angus Seller, [place] 06/24/12 [phone]

## 2012-06-24 NOTE — ED Notes (Signed)
Pt diagnosed with uri last wk; started on amoxicillin on Friday d/t possible strep (child has strep); began n/v/d last night at 2000

## 2012-06-28 ENCOUNTER — Ambulatory Visit: Payer: PRIVATE HEALTH INSURANCE | Attending: Family Medicine | Admitting: Physical Therapy

## 2012-06-28 DIAGNOSIS — M25519 Pain in unspecified shoulder: Secondary | ICD-10-CM | POA: Insufficient documentation

## 2012-06-28 DIAGNOSIS — IMO0001 Reserved for inherently not codable concepts without codable children: Secondary | ICD-10-CM | POA: Insufficient documentation

## 2012-06-28 DIAGNOSIS — M25619 Stiffness of unspecified shoulder, not elsewhere classified: Secondary | ICD-10-CM | POA: Insufficient documentation

## 2012-07-01 ENCOUNTER — Ambulatory Visit: Payer: PRIVATE HEALTH INSURANCE | Attending: Family Medicine | Admitting: Physical Therapy

## 2012-07-01 DIAGNOSIS — M25519 Pain in unspecified shoulder: Secondary | ICD-10-CM | POA: Insufficient documentation

## 2012-07-01 DIAGNOSIS — IMO0001 Reserved for inherently not codable concepts without codable children: Secondary | ICD-10-CM | POA: Insufficient documentation

## 2012-07-06 ENCOUNTER — Ambulatory Visit: Payer: PRIVATE HEALTH INSURANCE | Attending: Family Medicine | Admitting: Physical Therapy

## 2012-07-06 DIAGNOSIS — M25519 Pain in unspecified shoulder: Secondary | ICD-10-CM | POA: Insufficient documentation

## 2012-07-06 DIAGNOSIS — IMO0001 Reserved for inherently not codable concepts without codable children: Secondary | ICD-10-CM | POA: Insufficient documentation

## 2012-07-09 ENCOUNTER — Ambulatory Visit: Payer: PRIVATE HEALTH INSURANCE | Admitting: Physical Therapy

## 2012-07-12 ENCOUNTER — Other Ambulatory Visit (HOSPITAL_COMMUNITY): Payer: Self-pay | Admitting: Family Medicine

## 2012-07-12 DIAGNOSIS — S46912A Strain of unspecified muscle, fascia and tendon at shoulder and upper arm level, left arm, initial encounter: Secondary | ICD-10-CM

## 2012-07-15 ENCOUNTER — Ambulatory Visit (HOSPITAL_COMMUNITY)
Admission: RE | Admit: 2012-07-15 | Discharge: 2012-07-15 | Disposition: A | Discharge status: Home / Self Care | Payer: PRIVATE HEALTH INSURANCE | Source: Ambulatory Visit | Attending: Family Medicine | Admitting: Family Medicine

## 2012-07-15 DIAGNOSIS — S46912A Strain of unspecified muscle, fascia and tendon at shoulder and upper arm level, left arm, initial encounter: Secondary | ICD-10-CM

## 2012-07-15 DIAGNOSIS — M25519 Pain in unspecified shoulder: Secondary | ICD-10-CM | POA: Insufficient documentation

## 2012-08-14 ENCOUNTER — Other Ambulatory Visit: Payer: Self-pay

## 2012-12-30 ENCOUNTER — Encounter (HOSPITAL_COMMUNITY): Payer: Self-pay

## 2012-12-30 ENCOUNTER — Ambulatory Visit (HOSPITAL_COMMUNITY)
Admission: RE | Admit: 2012-12-30 | Discharge: 2012-12-30 | Disposition: A | Discharge status: Home / Self Care | Payer: 59 | Source: Ambulatory Visit | Attending: Family Medicine | Admitting: Family Medicine

## 2012-12-30 ENCOUNTER — Other Ambulatory Visit (HOSPITAL_COMMUNITY): Payer: Self-pay | Admitting: Family Medicine

## 2012-12-30 DIAGNOSIS — H538 Other visual disturbances: Secondary | ICD-10-CM | POA: Insufficient documentation

## 2012-12-30 DIAGNOSIS — R51 Headache: Secondary | ICD-10-CM | POA: Insufficient documentation

## 2012-12-30 DIAGNOSIS — R42 Dizziness and giddiness: Secondary | ICD-10-CM | POA: Insufficient documentation

## 2012-12-30 DIAGNOSIS — G9389 Other specified disorders of brain: Secondary | ICD-10-CM | POA: Insufficient documentation

## 2012-12-30 DIAGNOSIS — R9089 Other abnormal findings on diagnostic imaging of central nervous system: Secondary | ICD-10-CM

## 2012-12-30 MED ORDER — IOHEXOL 300 MG/ML  SOLN
100.0000 mL | Freq: Once | INTRAMUSCULAR | Status: AC | PRN
  Administered 2012-12-30: 100 mL via INTRAVENOUS

## 2013-01-03 ENCOUNTER — Other Ambulatory Visit (HOSPITAL_COMMUNITY): Payer: Self-pay

## 2013-01-05 ENCOUNTER — Ambulatory Visit (HOSPITAL_COMMUNITY)
Admission: RE | Admit: 2013-01-05 | Discharge: 2013-01-05 | Disposition: A | Discharge status: Home / Self Care | Payer: 59 | Source: Ambulatory Visit | Attending: Family Medicine | Admitting: Family Medicine

## 2013-01-05 DIAGNOSIS — H538 Other visual disturbances: Secondary | ICD-10-CM | POA: Insufficient documentation

## 2013-01-05 DIAGNOSIS — R9089 Other abnormal findings on diagnostic imaging of central nervous system: Secondary | ICD-10-CM

## 2013-01-05 DIAGNOSIS — G9389 Other specified disorders of brain: Secondary | ICD-10-CM | POA: Insufficient documentation

## 2013-01-05 DIAGNOSIS — R42 Dizziness and giddiness: Secondary | ICD-10-CM | POA: Insufficient documentation

## 2013-01-05 MED ORDER — GADOBENATE DIMEGLUMINE 529 MG/ML IV SOLN
20.0000 mL | Freq: Once | INTRAVENOUS | Status: AC | PRN
  Administered 2013-01-05: 20 mL via INTRAVENOUS

## 2013-02-18 ENCOUNTER — Telehealth (INDEPENDENT_AMBULATORY_CARE_PROVIDER_SITE_OTHER): Payer: Self-pay | Admitting: General Surgery

## 2013-02-18 ENCOUNTER — Encounter (INDEPENDENT_AMBULATORY_CARE_PROVIDER_SITE_OTHER): Payer: Self-pay | Admitting: General Surgery

## 2013-02-18 ENCOUNTER — Ambulatory Visit (INDEPENDENT_AMBULATORY_CARE_PROVIDER_SITE_OTHER): Payer: Commercial Managed Care - PPO | Admitting: General Surgery

## 2013-02-18 DIAGNOSIS — Z6841 Body Mass Index (BMI) 40.0 and over, adult: Secondary | ICD-10-CM

## 2013-02-18 DIAGNOSIS — I10 Essential (primary) hypertension: Secondary | ICD-10-CM

## 2013-02-18 DIAGNOSIS — F411 Generalized anxiety disorder: Secondary | ICD-10-CM

## 2013-02-18 NOTE — Telephone Encounter (Signed)
Please put EGD orders in Epic so I can schedule.

## 2013-02-18 NOTE — Progress Notes (Signed)
Patient ID: [name], female   DOB: 10/01/1975, 37 y.o.   MRN: [id number]  Chief Complaint  Patient presents with  . Bariatric Pre-op    sleeve initial    HPI [name] is a 37 y.o. female.  This patient comes in for her initial weight loss surgery evaluation. She has attended our information session and is interested in vertical sleeve gastrectomy. She has a BMI of 45 with obesity related comorbidities of hypertension, anxiety, GERD, and arthritis in shoulders and back. She says that she also has headaches and was told by her family physician that this is due to intracranial pressure from her obesity. She is nondiabetic but does have a family history of cardiac issues her mother died of a heart attack at age 73. She says that she has struggled with her weight ever since her pregnancy and has tried several diets without the ability to reduce the weight. Her most affected that was Atkins diet which she lost about 40 pounds but she regained the weight. She's working 3 jobs currently and is not currently working out due to her work schedule. 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  She would be okay with the gastric bypass but really does not want to have this done. She does have reflux which she said was worse with her pregnancies and she says is usually dilated and is worse when she eats too much. She did try to come off of her PPIs this week and as such that she can only go about 3 days without needing to take her medicine but again she says that that was likely diet related. HPI  Past Medical History  Diagnosis Date  . Hypertension   . GERD (gastroesophageal reflux disease)     Past Surgical History  Procedure Laterality Date  . Cholecystectomy  03/2007  . Cesarean section  10/24/98,08/05/05,01/18/07,01/21/10    x4  . Tonsilectomy/adenoidectomy with myringotomy  1983     Family History  Problem Relation Age of Onset  . COPD Father     Social History History  Substance Use Topics  . Smoking status: Never Smoker   . Smokeless tobacco: Not on file  . Alcohol Use: Yes     Comment: occa    No Known Allergies  Current Outpatient Prescriptions  Medication Sig Dispense Refill  . ALPRAZolam (XANAX) 0.5 MG tablet Take 0.5 mg by mouth at bedtime as needed. Anxiety      . lisinopril-hydrochlorothiazide (PRINZIDE,ZESTORETIC) 10-12.5 MG per tablet Take 1 tablet by mouth daily.      . Multiple Vitamin (MULTIVITAMIN WITH MINERALS) TABS Take 1 tablet by mouth daily.      Marland Kitchen omeprazole (PRILOSEC) 40 MG capsule Take 40 mg by mouth daily.      . Vitamin D, Ergocalciferol, (DRISDOL) 50000 UNITS CAPS Take 50,000 Units by mouth every 7 (seven) days. Takes on Mondays      . acetaminophen (TYLENOL) 500 MG tablet Take 1,000 mg by mouth every 6 (six) hours as needed. Pain      . amoxicillin (AMOXIL) 500 MG capsule Take 500 mg by mouth 2 (two) times daily.      . ondansetron (ZOFRAN ODT) 8 MG disintegrating tablet 8mg  ODT q4 hours prn nausea  20 tablet  0   No current facility-administered medications for this visit.    Review  of Systems Review of Systems All other review of systems negative or noncontributory except as stated in the HPI  Blood pressure 122/88, pulse 106, temperature 97.1 F (36.2 C), temperature source Temporal, resp. rate 18, height 5' 4.25" (1.632 m), weight 265 lb 9.6 oz (120.475 kg), SpO2 99.00%.  Physical Exam Physical Exam Physical Exam  Nursing note and vitals reviewed. Constitutional: She is oriented to person, place, and time. She appears well-developed and well-nourished. No distress.  HENT:  Head: Normocephalic and atraumatic.  Mouth/Throat: No oropharyngeal exudate.  Eyes: Conjunctivae and EOM are normal. Pupils are equal, round, and reactive to light. Right eye exhibits no discharge. Left eye exhibits no discharge. No scleral  icterus.  Neck: Normal range of motion. Neck supple. No tracheal deviation present.  Cardiovascular: Normal rate, regular rhythm, normal heart sounds and intact distal pulses.   Pulmonary/Chest: Effort normal and breath sounds normal. No stridor. No respiratory distress. She has no wheezes.  Abdominal: Soft. Bowel sounds are normal. She exhibits no distension and no mass. There is no tenderness. There is no rebound and no guarding.  Musculoskeletal: Normal range of motion. She exhibits no edema and no tenderness.  Neurological: She is alert and oriented to person, place, and time.  Skin: Skin is warm and dry. No rash noted. She is not diaphoretic. No erythema. No pallor.  Psychiatric: She has a normal mood and affect. Her behavior is normal. Judgment and thought content normal.    Data Reviewed   Assessment    Morbid obesity with a BMI of 45 and obesity related comorbidities of hypertension, anxiety, GERD, and arthritis We had a long discussion regarding the medical and surgical options for weight loss. We discussed the LAP-BAND, sleeve gastrectomy, and Roux-en-Y gastric bypass with the pros and cons of each option. I think that should be a fine candidate and qualify for any of the options that she chooses however with her reflux, I am concerned in performing a sleeve gastrectomy for her. However, this is really the only option that she wants. We did have a long discussion about sleeve gastrectomy and reflux and it does not sound as though her symptoms are that debilitating that she would totally be excluded from the sleeve gastrectomy. I explained that if she has an EGD and there are no esophageal changes, then I would still consider performing a sleeve gastrectomy for her with the understanding that this could make her reflux worse even to the point of no improvement with medication management and possibly needing conversion to gastric bypass. She said that she would be okay with this option but  really only wants to pursue a sleeve.  The risks of infection, bleeding, pain, scarring, weight regain, too little or too much weight loss, vitamin deficiencies and need for lifelong vitamin supplementation, hair loss, need for protein supplementation, leaks, stricture, reflux, food intolerance, need for reoperation and conversion to roux Y gastric bypass, need for open surgery, injury to spleen or surrounding structures, DVT's, PE, and death again discussed with the patient and the patient expressed understanding and desires to proceed with laparoscopic vertical sleeve gastrectomy, possible open, intraoperative endoscopy.     Plan    We will start her with the preoperative workup including laboratory studies, psychology and nutrition evaluation, and because of her long-standing reflux, instead of an upper GI, we will plan for EGD        Kalyse Meharg DAVID 02/18/2013, 12:26 PM

## 2013-02-23 LAB — CBC WITH DIFFERENTIAL/PLATELET
Basophils Absolute: 0 10*3/uL (ref 0.0–0.1)
Eosinophils Absolute: 0.1 10*3/uL (ref 0.0–0.7)
Eosinophils Relative: 1 % (ref 0–5)
Lymphocytes Relative: 29 % (ref 12–46)
MCV: 79.5 fL (ref 78.0–100.0)
Platelets: 396 10*3/uL (ref 150–400)
RDW: 16.8 % — ABNORMAL HIGH (ref 11.5–15.5)
WBC: 10.2 10*3/uL (ref 4.0–10.5)

## 2013-02-23 LAB — LIPID PANEL
HDL: 34 mg/dL — ABNORMAL LOW (ref >39)
LDL Cholesterol: 74 mg/dL (ref 0–99)
Total CHOL/HDL Ratio: 4.1 Ratio
Triglycerides: 152 mg/dL — ABNORMAL HIGH (ref <150)
VLDL: 30 mg/dL (ref 0–40)

## 2013-02-23 LAB — COMPREHENSIVE METABOLIC PANEL
ALT: 14 U/L (ref 0–35)
AST: 11 U/L (ref 0–37)
Alkaline Phosphatase: 99 U/L (ref 39–117)
Chloride: 104 mEq/L (ref 96–112)
Creat: 0.62 mg/dL (ref 0.50–1.10)
Total Bilirubin: 0.2 mg/dL — ABNORMAL LOW (ref 0.3–1.2)

## 2013-03-03 NOTE — Telephone Encounter (Signed)
Breath Tek scheduled for 03/04/13 @ [facility]

## 2013-03-04 ENCOUNTER — Ambulatory Visit (HOSPITAL_COMMUNITY)
Admission: RE | Admit: 2013-03-04 | Discharge: 2013-03-04 | Disposition: A | Discharge status: Home / Self Care | Payer: 59 | Source: Ambulatory Visit | Attending: General Surgery | Admitting: General Surgery

## 2013-03-04 ENCOUNTER — Encounter (HOSPITAL_COMMUNITY)
Admission: RE | Disposition: A | Discharge status: Home / Self Care | Payer: Self-pay | Source: Ambulatory Visit | Attending: General Surgery

## 2013-03-04 DIAGNOSIS — Z01818 Encounter for other preprocedural examination: Secondary | ICD-10-CM | POA: Insufficient documentation

## 2013-03-04 DIAGNOSIS — I517 Cardiomegaly: Secondary | ICD-10-CM | POA: Insufficient documentation

## 2013-03-04 DIAGNOSIS — Z6841 Body Mass Index (BMI) 40.0 and over, adult: Secondary | ICD-10-CM | POA: Insufficient documentation

## 2013-03-04 DIAGNOSIS — F411 Generalized anxiety disorder: Secondary | ICD-10-CM | POA: Insufficient documentation

## 2013-03-04 DIAGNOSIS — I10 Essential (primary) hypertension: Secondary | ICD-10-CM | POA: Insufficient documentation

## 2013-03-04 DIAGNOSIS — K219 Gastro-esophageal reflux disease without esophagitis: Secondary | ICD-10-CM | POA: Insufficient documentation

## 2013-03-04 HISTORY — PX: BREATH TEK H PYLORI: SHX5422

## 2013-03-04 SURGERY — BREATH TEST, FOR HELICOBACTER PYLORI

## 2013-03-07 ENCOUNTER — Encounter (HOSPITAL_COMMUNITY): Payer: Self-pay | Admitting: General Surgery

## 2013-03-08 ENCOUNTER — Ambulatory Visit: Payer: Self-pay | Admitting: Dietician

## 2013-03-09 ENCOUNTER — Encounter: Payer: 59 | Attending: General Surgery | Admitting: Dietician

## 2013-03-09 VITALS — Ht 64.0 in | Wt 264.2 lb

## 2013-03-09 DIAGNOSIS — Z713 Dietary counseling and surveillance: Secondary | ICD-10-CM | POA: Insufficient documentation

## 2013-03-09 DIAGNOSIS — E669 Obesity, unspecified: Secondary | ICD-10-CM | POA: Insufficient documentation

## 2013-03-09 NOTE — Patient Instructions (Addendum)
Patient to call the [facility] to enroll in Pre-Op and Post-Op Nutrition Education when surgery date is scheduled. 

## 2013-03-09 NOTE — Progress Notes (Signed)
  Pre-Op Assessment Visit:  Pre-Operative Sleeve Gastrectomy Surgery  Medical Nutrition Therapy:  Appt start time: 1530   End time:  1615.  Patient was seen on 03/09/2013 for Pre-Operative Sleeve Gastrectomy Nutrition Assessment. Assessment and letter of approval faxed to [facility] Bariatric Surgery Program coordinator on 03/09/2013.   Handouts given during visit include:  Pre-Op Goals Bariatric Surgery Protein Shakes Pre-Op Diet  Patient to call the [facility] to enroll in Pre-Op and Post-Op Nutrition Education when surgery date is scheduled.

## 2013-03-24 ENCOUNTER — Other Ambulatory Visit (INDEPENDENT_AMBULATORY_CARE_PROVIDER_SITE_OTHER): Payer: Self-pay

## 2013-03-24 ENCOUNTER — Telehealth (INDEPENDENT_AMBULATORY_CARE_PROVIDER_SITE_OTHER): Payer: Self-pay | Admitting: *Deleted

## 2013-03-24 DIAGNOSIS — K222 Esophageal obstruction: Secondary | ICD-10-CM

## 2013-03-24 NOTE — Telephone Encounter (Signed)
LMOM regarding patients appt with Dr. Elnoria Howard located at [address] [facility]. on 03/30/13 with an arrival time of 1:30.

## 2013-04-14 ENCOUNTER — Telehealth (INDEPENDENT_AMBULATORY_CARE_PROVIDER_SITE_OTHER): Payer: Self-pay

## 2013-04-14 NOTE — Telephone Encounter (Signed)
Pt calledDr Biagio Quint back to let him know that her endoscopy with Dr Elnoria Howard is next Tuesday 10/21.

## 2013-05-05 ENCOUNTER — Other Ambulatory Visit: Payer: Self-pay

## 2013-05-10 NOTE — Progress Notes (Signed)
Dr Biagio Quint-  NEED PRE OP ORDERS PLEASE   THANKS

## 2013-05-12 ENCOUNTER — Encounter: Payer: 59 | Attending: General Surgery

## 2013-05-12 DIAGNOSIS — E669 Obesity, unspecified: Secondary | ICD-10-CM | POA: Insufficient documentation

## 2013-05-12 DIAGNOSIS — Z713 Dietary counseling and surveillance: Secondary | ICD-10-CM | POA: Insufficient documentation

## 2013-05-13 NOTE — Progress Notes (Signed)
Pre-Operative Nursing and Nutrition Class:  Appt start time: 1730   End time:  1930. Patient was seen on 05/12/13 for Pre-Operative Bariatric Surgery Education at the [facility].  Surgery date: 05/30/13 Surgery type: Gastric Sleeve The following the learning objectives were met by the patient during this course: Identify Pre-Op Dietary Goals and will begin 2 weeks pre-operatively Identify appropriate sources of fluids and proteins   State protein recommendations and appropriate sources pre and post-operatively Identify Post-Operative Dietary Goals and will follow for 2 weeks post-operatively Identify appropriate multivitamin and calcium sources Describe the need for physical activity post-operatively and will follow MD recommendations State when to call healthcare provider regarding medication questions or post-operative complications Handouts given during class include: Pre-Op Bariatric Surgery Diet Handout Protein Shake Handout Post-Op Bariatric Surgery Nutrition Handout BELT Program Information Flyer Support Group Information Flyer [facility] Bariatric Supplements Price List Follow-Up Plan: Patient will follow-up at [facility] 2 weeks post operatively for diet advancement per MD. Patient Instructions Follow:  Pre-Op Diet per MD 2 weeks prior to surgery   Phase 2- Liquids (clear/full) 2 weeks after surgery   Vitamin/Mineral/Calcium guidelines for purchasing bariatric supplements   Exercise guidelines pre and post-op per MD Follow-up at [facility] in 2 weeks post-op for diet advancement. Contact [facility] at [phone] or Skip Estimable [phone] [email or web address] with any questions or concerns Follow-up and Disposition    Return in about 5 weeks (06/14/13) for 2 Week Post-Op Class.

## 2013-05-17 ENCOUNTER — Encounter (HOSPITAL_COMMUNITY): Payer: Self-pay | Admitting: Pharmacy Technician

## 2013-05-17 NOTE — Progress Notes (Signed)
Dr Biagio Quint-  NEED PRE OP ORDERS PLEASE--- HAS PST APPT  05/18/13 0900  Thanks

## 2013-05-17 NOTE — Progress Notes (Signed)
EKG  8/14 epic

## 2013-05-17 NOTE — Patient Instructions (Addendum)
20 [name]  05/17/2013   Your procedure is scheduled on:  05/30/13  MONDAY  Report to [facility] at  0530     AM.  Call this number if you have problems the morning of surgery: [phone]       Remember:   Do not eat food  Or drink :After Midnight. Sunday NIGHT   Take these medicines the morning of surgery with A SIP OF WATER: NONE                                        May take Alprazolam if needed   .  Contacts, dentures or partial plates can not be worn to surgery  Leave suitcase in the car. After surgery it may be brought to your room.  For patients admitted to the hospital, checkout time is 11:00 AM day of  discharge.             SPECIAL INSTRUCTIONS- SEE [place] PREPARING FOR SURGERY INSTRUCTION SHEET-     DO NOT WEAR JEWELRY, LOTIONS, POWDERS, OR PERFUMES.  WOMEN-- DO NOT SHAVE LEGS OR UNDERARMS FOR 12 HOURS BEFORE SHOWERS. MEN MAY SHAVE FACE.  Patients discharged the day of surgery will not be allowed to drive home. IF going home the day of surgery, you must have a driver and someone to stay with you for the first 24 hours  Name and phone number of your driver:       ADMISSION                                                                                                       [name]  PST [phone]  [phone]                 FAILURE TO FOLLOW THESE INSTRUCTIONS MAY RESULT IN  CANCELLATION   OF YOUR SURGERY                                                  Patient Signature _____________________________

## 2013-05-18 ENCOUNTER — Ambulatory Visit (HOSPITAL_COMMUNITY)
Admission: RE | Admit: 2013-05-18 | Discharge: 2013-05-18 | Disposition: A | Discharge status: Home / Self Care | Payer: 59 | Source: Ambulatory Visit | Attending: General Surgery | Admitting: General Surgery

## 2013-05-18 ENCOUNTER — Encounter (HOSPITAL_COMMUNITY)
Admission: RE | Admit: 2013-05-18 | Discharge: 2013-05-18 | Disposition: A | Discharge status: Home / Self Care | Payer: 59 | Source: Ambulatory Visit | Attending: General Surgery | Admitting: General Surgery

## 2013-05-18 ENCOUNTER — Encounter (HOSPITAL_COMMUNITY): Payer: Self-pay

## 2013-05-18 DIAGNOSIS — Z01812 Encounter for preprocedural laboratory examination: Secondary | ICD-10-CM | POA: Insufficient documentation

## 2013-05-18 DIAGNOSIS — Z01818 Encounter for other preprocedural examination: Secondary | ICD-10-CM | POA: Insufficient documentation

## 2013-05-18 HISTORY — DX: Unspecified osteoarthritis, unspecified site: M19.90

## 2013-05-18 LAB — BASIC METABOLIC PANEL
BUN: 15 mg/dL (ref 6–23)
Calcium: 9.8 mg/dL (ref 8.4–10.5)
GFR calc non Af Amer: >90 mL/min (ref >90)
Glucose, Bld: 96 mg/dL (ref 70–99)
Potassium: 3.8 mEq/L (ref 3.5–5.1)

## 2013-05-18 LAB — CBC
HCT: 39.3 % (ref 36.0–46.0)
Hemoglobin: 12.6 g/dL (ref 12.0–15.0)
MCH: 26 pg (ref 26.0–34.0)
MCHC: 32.1 g/dL (ref 30.0–36.0)

## 2013-05-20 ENCOUNTER — Ambulatory Visit (INDEPENDENT_AMBULATORY_CARE_PROVIDER_SITE_OTHER): Payer: Commercial Managed Care - PPO | Admitting: General Surgery

## 2013-05-20 NOTE — Progress Notes (Signed)
PLEASE ADD PRE OP ORDERS WHEN YOU CAN   THANKS

## 2013-05-30 ENCOUNTER — Encounter (HOSPITAL_COMMUNITY): Admission: RE | Payer: Self-pay | Source: Ambulatory Visit

## 2013-05-30 ENCOUNTER — Inpatient Hospital Stay (HOSPITAL_COMMUNITY): Admission: RE | Admit: 2013-05-30 | Payer: 59 | Source: Ambulatory Visit | Admitting: General Surgery

## 2013-05-30 SURGERY — GASTRECTOMY, SLEEVE, LAPAROSCOPIC
Anesthesia: General

## 2013-06-14 ENCOUNTER — Encounter: Payer: 59 | Attending: General Surgery

## 2013-06-14 DIAGNOSIS — E669 Obesity, unspecified: Secondary | ICD-10-CM | POA: Insufficient documentation

## 2013-06-14 DIAGNOSIS — Z713 Dietary counseling and surveillance: Secondary | ICD-10-CM | POA: Insufficient documentation

## 2013-06-17 ENCOUNTER — Encounter (INDEPENDENT_AMBULATORY_CARE_PROVIDER_SITE_OTHER): Payer: Commercial Managed Care - PPO | Admitting: General Surgery

## 2013-06-17 ENCOUNTER — Encounter (INDEPENDENT_AMBULATORY_CARE_PROVIDER_SITE_OTHER): Payer: Commercial Managed Care - PPO | Admitting: Surgery

## 2014-04-06 ENCOUNTER — Other Ambulatory Visit: Payer: Self-pay | Admitting: Obstetrics and Gynecology

## 2014-04-10 LAB — CYTOLOGY - PAP

## 2015-08-07 MED FILL — OSELTAMIVIR PHOS 75 MG CAP: 75 | 10 days supply | Qty: 10 | Fill #0

## 2015-08-14 DIAGNOSIS — D229 Melanocytic nevi, unspecified: Secondary | ICD-10-CM | POA: Diagnosis not present

## 2015-08-14 DIAGNOSIS — Z111 Encounter for screening for respiratory tuberculosis: Secondary | ICD-10-CM | POA: Diagnosis not present

## 2015-08-14 DIAGNOSIS — Z1159 Encounter for screening for other viral diseases: Secondary | ICD-10-CM | POA: Diagnosis not present

## 2015-08-14 DIAGNOSIS — Z0000 Encounter for general adult medical examination without abnormal findings: Secondary | ICD-10-CM | POA: Diagnosis not present

## 2015-08-17 DIAGNOSIS — Z0000 Encounter for general adult medical examination without abnormal findings: Secondary | ICD-10-CM | POA: Diagnosis not present

## 2015-08-17 DIAGNOSIS — Z13220 Encounter for screening for lipoid disorders: Secondary | ICD-10-CM | POA: Diagnosis not present

## 2015-08-17 DIAGNOSIS — Z1159 Encounter for screening for other viral diseases: Secondary | ICD-10-CM | POA: Diagnosis not present

## 2015-08-20 MED FILL — OMEPRAZOLE DR 40 MG CAPSULE: 40 | 90 days supply | Qty: 90 | Fill #0

## 2015-09-03 MED FILL — ESCITALOPRAM 20 MG TABLET: 20 | 90 days supply | Qty: 90 | Fill #0

## 2015-10-04 DIAGNOSIS — K589 Irritable bowel syndrome without diarrhea: Secondary | ICD-10-CM | POA: Diagnosis not present

## 2015-10-04 DIAGNOSIS — J45909 Unspecified asthma, uncomplicated: Secondary | ICD-10-CM | POA: Diagnosis not present

## 2015-10-04 DIAGNOSIS — K219 Gastro-esophageal reflux disease without esophagitis: Secondary | ICD-10-CM | POA: Diagnosis not present

## 2015-10-04 MED FILL — AMOX-CLAV 875-125 MG TABLET: 875-125 | 10 days supply | Qty: 20 | Fill #0

## 2015-10-08 MED FILL — LISINOPRIL-HCTZ 10-12.5 MG: 10-12.5 | 90 days supply | Qty: 90 | Fill #0

## 2015-11-19 MED FILL — OMEPRAZOLE DR 40 MG CAPSULE: 40 | 90 days supply | Qty: 90 | Fill #1

## 2015-12-07 MED FILL — ESCITALOPRAM 20 MG TABLET: 20 | 90 days supply | Qty: 90 | Fill #0

## 2015-12-28 DIAGNOSIS — F411 Generalized anxiety disorder: Secondary | ICD-10-CM | POA: Diagnosis not present

## 2015-12-28 DIAGNOSIS — H8110 Benign paroxysmal vertigo, unspecified ear: Secondary | ICD-10-CM | POA: Diagnosis not present

## 2015-12-28 DIAGNOSIS — J4520 Mild intermittent asthma, uncomplicated: Secondary | ICD-10-CM | POA: Diagnosis not present

## 2015-12-28 DIAGNOSIS — D649 Anemia, unspecified: Secondary | ICD-10-CM | POA: Diagnosis not present

## 2015-12-28 DIAGNOSIS — I10 Essential (primary) hypertension: Secondary | ICD-10-CM | POA: Diagnosis not present

## 2015-12-28 DIAGNOSIS — K219 Gastro-esophageal reflux disease without esophagitis: Secondary | ICD-10-CM | POA: Diagnosis not present

## 2015-12-28 MED FILL — LISINOPRIL-HCTZ 10-12.5 MG: 10-12.5 | 90 days supply | Qty: 90 | Fill #0

## 2015-12-28 MED FILL — ALPRAZolam 0.5 MG TABS: 0.5 | 30 days supply | Qty: 30 | Fill #0

## 2015-12-28 MED FILL — VENTOLIN HFA 90 MCG INHALER: 108 (90 BAS | 16 days supply | Qty: 18 | Fill #0

## 2015-12-28 MED FILL — FLUoxetine HCL 20 MG CAPS: 20 | 30 days supply | Qty: 30 | Fill #0

## 2015-12-28 MED FILL — OMEPRAZOLE DR 20 MG CAPSULE: 20 | 90 days supply | Qty: 90 | Fill #0

## 2016-02-01 MED FILL — FLUoxetine HCL 20 MG CAPS: 20 | 30 days supply | Qty: 30 | Fill #1

## 2016-02-13 DIAGNOSIS — Z01419 Encounter for gynecological examination (general) (routine) without abnormal findings: Secondary | ICD-10-CM | POA: Diagnosis not present

## 2016-02-13 DIAGNOSIS — Z6841 Body Mass Index (BMI) 40.0 and over, adult: Secondary | ICD-10-CM | POA: Diagnosis not present

## 2016-03-07 MED FILL — FLUoxetine HCL 20 MG CAPS: 20 | 30 days supply | Qty: 30 | Fill #0

## 2016-03-14 MED FILL — ALPRAZolam 0.5 MG TABS: 0.5 | 15 days supply | Qty: 30 | Fill #0

## 2016-04-01 MED FILL — LISINOPRIL-HCTZ 10-12.5 MG: 10-12.5 | 90 days supply | Qty: 90 | Fill #1

## 2016-04-01 MED FILL — OMEPRAZOLE 20 MG CAPSULE DR: 20 | 90 days supply | Qty: 90 | Fill #1

## 2016-04-01 MED FILL — predniSONE 5 MG (48) TBPK: 5 | 12 days supply | Qty: 48 | Fill #0

## 2016-04-10 MED FILL — FLUoxetine HCL 20 MG CAPS: 20 | 30 days supply | Qty: 30 | Fill #0

## 2016-04-16 DIAGNOSIS — F419 Anxiety disorder, unspecified: Secondary | ICD-10-CM | POA: Diagnosis not present

## 2016-04-16 DIAGNOSIS — F4320 Adjustment disorder, unspecified: Secondary | ICD-10-CM | POA: Diagnosis not present

## 2016-06-03 MED FILL — ALPRAZolam 0.5 MG TABS: 0.5 | 15 days supply | Qty: 30 | Fill #0

## 2016-07-02 MED FILL — LISINOPRIL-HCTZ 10-12.5 MG: 10-12.5 | 90 days supply | Qty: 90 | Fill #0

## 2016-07-02 MED FILL — OMEPRAZOLE 20 MG CAPSULE DR: 20 | 90 days supply | Qty: 90 | Fill #0

## 2016-07-09 MED FILL — ESCITALOPRAM 20 MG TABLET: 20 | 30 days supply | Qty: 30 | Fill #0

## 2016-08-08 MED FILL — ESCITALOPRAM 20 MG TABLET: 20 | 30 days supply | Qty: 30 | Fill #0

## 2016-08-08 MED FILL — ALPRAZolam 0.5 MG TABS: 0.5 | 15 days supply | Qty: 30 | Fill #0

## 2016-09-17 MED FILL — ESCITALOPRAM 20 MG TABLET: 20 | 30 days supply | Qty: 30 | Fill #1

## 2016-09-29 MED FILL — LISINOPRIL-HCTZ 10-12.5 MG: 10-12.5 | 90 days supply | Qty: 90 | Fill #1

## 2016-09-29 MED FILL — OMEPRAZOLE 20 MG CAPSULE DR: 20 | 90 days supply | Qty: 90 | Fill #1

## 2016-10-23 ENCOUNTER — Ambulatory Visit: Payer: Self-pay | Admitting: Nurse Practitioner

## 2016-10-28 ENCOUNTER — Encounter: Payer: Self-pay | Admitting: Nurse Practitioner

## 2016-10-28 ENCOUNTER — Ambulatory Visit (INDEPENDENT_AMBULATORY_CARE_PROVIDER_SITE_OTHER): Payer: 59 | Admitting: Nurse Practitioner

## 2016-10-28 ENCOUNTER — Other Ambulatory Visit (INDEPENDENT_AMBULATORY_CARE_PROVIDER_SITE_OTHER): Payer: 59

## 2016-10-28 VITALS — BP 124/72 | HR 83 | Temp 97.9°F | Ht 64.5 in | Wt 282.1 lb

## 2016-10-28 DIAGNOSIS — Z9889 Other specified postprocedural states: Secondary | ICD-10-CM

## 2016-10-28 DIAGNOSIS — Z8719 Personal history of other diseases of the digestive system: Secondary | ICD-10-CM | POA: Insufficient documentation

## 2016-10-28 DIAGNOSIS — F329 Major depressive disorder, single episode, unspecified: Secondary | ICD-10-CM | POA: Insufficient documentation

## 2016-10-28 DIAGNOSIS — K2100 Gastro-esophageal reflux disease with esophagitis, without bleeding: Secondary | ICD-10-CM | POA: Insufficient documentation

## 2016-10-28 DIAGNOSIS — E559 Vitamin D deficiency, unspecified: Secondary | ICD-10-CM

## 2016-10-28 DIAGNOSIS — F332 Major depressive disorder, recurrent severe without psychotic features: Secondary | ICD-10-CM | POA: Diagnosis not present

## 2016-10-28 DIAGNOSIS — E612 Magnesium deficiency: Secondary | ICD-10-CM

## 2016-10-28 DIAGNOSIS — F5105 Insomnia due to other mental disorder: Secondary | ICD-10-CM

## 2016-10-28 DIAGNOSIS — I10 Essential (primary) hypertension: Secondary | ICD-10-CM | POA: Diagnosis not present

## 2016-10-28 DIAGNOSIS — G4700 Insomnia, unspecified: Secondary | ICD-10-CM | POA: Insufficient documentation

## 2016-10-28 DIAGNOSIS — F32A Depression, unspecified: Secondary | ICD-10-CM | POA: Insufficient documentation

## 2016-10-28 DIAGNOSIS — F99 Mental disorder, not otherwise specified: Secondary | ICD-10-CM

## 2016-10-28 LAB — BASIC METABOLIC PANEL
BUN: 13 mg/dL (ref 6–23)
CO2: 30 mEq/L (ref 19–32)
Calcium: 9.3 mg/dL (ref 8.4–10.5)
Chloride: 102 mEq/L (ref 96–112)
Creatinine, Ser: 0.51 mg/dL (ref 0.40–1.20)
GFR: 141.84 mL/min (ref >60.00)
GLUCOSE: 104 mg/dL — AB (ref 70–99)
Potassium: 3.7 mEq/L (ref 3.5–5.1)
Sodium: 138 mEq/L (ref 135–145)

## 2016-10-28 LAB — MAGNESIUM: Magnesium: 2 mg/dL (ref 1.5–2.5)

## 2016-10-28 MED ORDER — OMEPRAZOLE 20 MG PO CPDR: 20.0000 mg | DELAYED_RELEASE_CAPSULE | Freq: Every day | ORAL | 0 refills | Status: DC

## 2016-10-28 MED ORDER — ESCITALOPRAM OXALATE 20 MG PO TABS: 20.0000 mg | ORAL_TABLET | Freq: Every day | ORAL | 0 refills | Status: DC

## 2016-10-28 MED ORDER — ESCITALOPRAM OXALATE 20 MG PO TABS: 30.0000 mg | ORAL_TABLET | Freq: Every day | ORAL | 0 refills | Status: DC

## 2016-10-28 MED ORDER — ALPRAZOLAM 0.5 MG PO TABS: 0.5000 mg | ORAL_TABLET | Freq: Every evening | ORAL | 0 refills | Status: DC | PRN

## 2016-10-28 MED ORDER — LISINOPRIL-HYDROCHLOROTHIAZIDE 10-12.5 MG PO TABS: 1.0000 | ORAL_TABLET | Freq: Every day | ORAL | 0 refills | Status: DC

## 2016-10-28 MED ORDER — DOXEPIN HCL 25 MG PO CAPS: 25.0000 mg | ORAL_CAPSULE | Freq: Every day | ORAL | 0 refills | Status: DC

## 2016-10-28 MED FILL — ALPRAZolam 0.5 MG TABS: 0.5 | 30 days supply | Qty: 30 | Fill #0

## 2016-10-28 MED FILL — DOXEPIN 25 MG CAPSULE: 25 | 30 days supply | Qty: 30 | Fill #0

## 2016-10-28 MED FILL — ESCITALOPRAM 20 MG TABLET: 20 | 30 days supply | Qty: 45 | Fill #0

## 2016-10-28 NOTE — Assessment & Plan Note (Signed)
Maintain lexapro. Start doxepin. Wean off xanax: next prescription should be 12/28/2016 (20tabs only)

## 2016-10-28 NOTE — Progress Notes (Signed)
Pre visit review using our clinic review tool, if applicable. No additional management support is needed unless otherwise documented below in the visit note. 

## 2016-10-28 NOTE — Assessment & Plan Note (Signed)
Prolonged use of PPI. Consider switching to pepcid or zantac due to vitamin d and magnesium deficiency.

## 2016-10-28 NOTE — Patient Instructions (Addendum)
Please sign medical release to get records from [facility]. Do not take doxepin and xanax at same time. Maintain current dose of lexapro.  Complicated Grieving Grief is a normal response to the death of someone close to you. Feelings of fear, anger, and guilt can affect almost everyone who loses a loved one. It is also common to have symptoms of depression while you are grieving. These include problems with sleep, loss of appetite, and lack of energy. They may last for weeks or months after a loss. Complicated grief is different from normal grief or depression. Normal grieving involves sadness and feelings of loss, but these feelings are not constant. Complicated grief is a constant and severe type of grief. It interferes with your ability to function normally. It may last for several months to a year or longer. Complicated grief may require treatment from a mental health care provider. What are the causes? It is not known why some people continue to struggle with grief and others do not. You may be at higher risk for complicated grief if:  The death of your loved one was sudden or unexpected.  The death of your loved one was due to a violent event.  Your loved one committed suicide.  Your loved one was a child or a young person.  You were very close to or dependent on the loved one.  You have a history of depression. What are the signs or symptoms? Signs and symptoms of complicated grief may include:  Feeling disbelief or numbness.  Being unable to enjoy good memories of your loved one.  Needing to avoid anything that reminds you of your loved one.  Being unable to stop thinking about the death.  Feeling intense anger or guilt.  Feeling alone and hopeless.  Feeling that your life is meaningless and empty.  Losing the desire to live. How is this diagnosed? Your health care provider may diagnose complicated grief if:  You have constant symptoms of grief for 6-12 months  or longer.  Your symptoms are interfering with your ability to live your life. Your health care provider may want you to see a mental health care provider. Many symptoms of depression are similar to the symptoms of complicated grief. It is important to be evaluated for complicated grief along with other mental health conditions. How is this treated? Talk therapy with a mental health provider is the most common treatment for complicated grief. During therapy, you will learn healthy ways to cope with the loss of your loved one. In some cases, your mental health care provider may also recommend antidepressant medicines. Follow these instructions at home:  Take care of yourself.  Eat regular meals and maintain a healthy diet. Eat plenty of fruits, vegetables, and whole grains.  Try to get some exercise each day.  Keep regular hours for sleep. Try to get at least 8 hours of sleep each night.  Do not use drugs or alcohol to ease your symptoms.  Take medicines only as directed by your health care provider.  Spend time with friends and loved ones.  Consider joining a grief (bereavement) support group to help you deal with your loss.  Keep all follow-up visits as directed by your health care provider. This is important. Contact a health care provider if:  Your symptoms keep you from functioning normally.  Your symptoms do not get better with treatment. Get help right away if:  You have serious thoughts of hurting yourself or someone else.  You  have suicidal feelings. This information is not intended to replace advice given to you by your health care provider. Make sure you discuss any questions you have with your health care provider. Document Released: 06/16/2005 Document Revised: 11/22/2015 Document Reviewed: 11/24/2013 Elsevier Interactive Patient Education  2017 [employer]. Escitalopram tablets What is this medicine? ESCITALOPRAM (es sye TAL oh pram) is used to treat depression  and certain types of anxiety. This medicine may be used for other purposes; ask your health care provider or pharmacist if you have questions. COMMON BRAND NAME(S): Lexapro What should I tell my health care provider before I take this medicine? They need to know if you have any of these conditions: -bipolar disorder or a family history of bipolar disorder -diabetes -glaucoma -heart disease -kidney or liver disease -receiving electroconvulsive therapy -seizures (convulsions) -suicidal thoughts, plans, or attempt by you or a family member -an unusual or allergic reaction to escitalopram, the related drug citalopram, other medicines, foods, dyes, or preservatives -pregnant or trying to become pregnant -breast-feeding How should I use this medicine? Take this medicine by mouth with a glass of water. Follow the directions on the prescription label. You can take it with or without food. If it upsets your stomach, take it with food. Take your medicine at regular intervals. Do not take it more often than directed. Do not stop taking this medicine suddenly except upon the advice of your doctor. Stopping this medicine too quickly may cause serious side effects or your condition may worsen. A special MedGuide will be given to you by the pharmacist with each prescription and refill. Be sure to read this information carefully each time. Talk to your pediatrician regarding the use of this medicine in children. Special care may be needed. Overdosage: If you think you have taken too much of this medicine contact a poison control center or emergency room at once. NOTE: This medicine is only for you. Do not share this medicine with others. What if I miss a dose? If you miss a dose, take it as soon as you can. If it is almost time for your next dose, take only that dose. Do not take double or extra doses. What may interact with this medicine? Do not take this medicine with any of the following  medications: -certain medicines for fungal infections like fluconazole, itraconazole, ketoconazole, posaconazole, voriconazole -cisapride -citalopram -dofetilide -dronedarone -linezolid -MAOIs like Carbex, Eldepryl, Marplan, Nardil, and Parnate -methylene blue (injected into a vein) -pimozide -thioridazine -ziprasidone This medicine may also interact with the following medications: -alcohol -amphetamines -aspirin and aspirin-like medicines -carbamazepine -certain medicines for depression, anxiety, or psychotic disturbances -certain medicines for migraine headache like almotriptan, eletriptan, frovatriptan, naratriptan, rizatriptan, sumatriptan, zolmitriptan -certain medicines for sleep -certain medicines that treat or prevent blood clots like warfarin, enoxaparin, dalteparin -cimetidine -diuretics -fentanyl -furazolidone -isoniazid -lithium -metoprolol -NSAIDs, medicines for pain and inflammation, like ibuprofen or naproxen -other medicines that prolong the QT interval (cause an abnormal heart rhythm) -procarbazine -rasagiline -supplements like St. John's wort, kava kava, valerian -tramadol -tryptophan This list may not describe all possible interactions. Give your health care provider a list of all the medicines, herbs, non-prescription drugs, or dietary supplements you use. Also tell them if you smoke, drink alcohol, or use illegal drugs. Some items may interact with your medicine. What should I watch for while using this medicine? Tell your doctor if your symptoms do not get better or if they get worse. Visit your doctor or health care professional for regular checks  on your progress. Because it may take several weeks to see the full effects of this medicine, it is important to continue your treatment as prescribed by your doctor. Patients and their families should watch out for new or worsening thoughts of suicide or depression. Also watch out for sudden changes in feelings  such as feeling anxious, agitated, panicky, irritable, hostile, aggressive, impulsive, severely restless, overly excited and hyperactive, or not being able to sleep. If this happens, especially at the beginning of treatment or after a change in dose, call your health care professional. Dennis Bast may get drowsy or dizzy. Do not drive, use machinery, or do anything that needs mental alertness until you know how this medicine affects you. Do not stand or sit up quickly, especially if you are an older patient. This reduces the risk of dizzy or fainting spells. Alcohol may interfere with the effect of this medicine. Avoid alcoholic drinks. Your mouth may get dry. Chewing sugarless gum or sucking hard candy, and drinking plenty of water may help. Contact your doctor if the problem does not go away or is severe. What side effects may I notice from receiving this medicine? Side effects that you should report to your doctor or health care professional as soon as possible: -allergic reactions like skin rash, itching or hives, swelling of the face, lips, or tongue -anxious -black, tarry stools -changes in vision -confusion -elevated mood, decreased need for sleep, racing thoughts, impulsive behavior -eye pain -fast, irregular heartbeat -feeling faint or lightheaded, falls -feeling agitated, angry, or irritable -hallucination, loss of contact with reality -loss of balance or coordination -loss of memory -painful or prolonged erections -restlessness, pacing, inability to keep still -seizures -stiff muscles -suicidal thoughts or other mood changes -trouble sleeping -unusual bleeding or bruising -unusually weak or tired -vomiting Side effects that usually do not require medical attention (report to your doctor or health care professional if they continue or are bothersome): -changes in appetite -change in sex drive or performance -headache -increased sweating -indigestion, nausea -tremors This list may  not describe all possible side effects. Call your doctor for medical advice about side effects. You may report side effects to FDA at 1-800-FDA-1088. Where should I keep my medicine? Keep out of reach of children. Store at room temperature between 15 and 30 degrees C (59 and 86 degrees F). Throw away any unused medicine after the expiration date. NOTE: This sheet is a summary. It may not cover all possible information. If you have questions about this medicine, talk to your doctor, pharmacist, or health care provider.  2018 Elsevier/Gold Standard (2015-11-19 13:20:23)

## 2016-10-28 NOTE — Progress Notes (Signed)
Subjective:    Patient ID: [name], female    DOB: 11-09-1975, 41 y.o.   MRN: [id number]  Patient presents today for establish care (new patient)  Depression       The patient presents with depression.  This is a recurrent problem.  The onset quality is undetermined.   The problem occurs daily.  The problem has been waxing and waning since onset.  Associated symptoms include fatigue, insomnia, restlessness, decreased interest, appetite change, body aches, indigestion and sad.  Associated symptoms include no decreased concentration, no helplessness, no hopelessness, not irritable, no myalgias, no headaches and no suicidal ideas.     The symptoms are aggravated by family issues (sudden death of parents).  Past treatments include SSRIs - Selective serotonin reuptake inhibitors and psychotherapy.  Compliance with treatment is variable.  Past compliance problems include medication issues (discomfort in talking to psychologist).  Risk factors include stress, family history of mental illness and a change in medication usage/dosage.   Past medical history includes anxiety, depression and mental health disorder.     Pertinent negatives include no hypothyroidism, no recent psychiatric admission, no bipolar disorder, no eating disorder, no obsessive-compulsive disorder, no post-traumatic stress disorder, no schizophrenia, no suicide attempts and no head trauma. Insomnia  Primary symptoms: fragmented sleep, sleep disturbance, premature morning awakening, malaise/fatigue, no napping.  The current episode started more than one year. The onset quality is undetermined. The problem occurs nightly. The problem has been waxing and waning since onset. The symptoms are aggravated by anxiety, bed partner, caffeine, medication changes and SSRI use. How many beverages per day that contain caffeine: 0 - 1.  Types of beverages you drink: coffee. The symptoms are relieved by medication. Past treatments include medication  (xanax and melatonin 3mg  prn). Typical bedtime:  8-10 P.M..  How long after going to bed to you fall asleep: 15-30 minutes.   Sleep duration: none.  PMH includes: hypertension, depression, family stress or anxiety.    Previous pcp: [facility]: [place], [place]. Last seen 18months. Last CPE over 1year.  Indication for xanax prescription: anxiety and depression Medication and dose:xanax 0.5mg  # pills per month: 30 Last UDS date: not done Date narcotic database last reviewed (include red flags): 10/28/2016 Pharmacy Used:[facility]  Depression: secondary to sudden death of parents (mother due to CAD/MI and father was killed by neighborhood boys) prozac, celexa, and wellbutrin tried in past. Medications stopped due to adverse side effects (Nausea, vomiting, Does not like psychotherapy. Current use of xanax and lexapro with minimal adverse effects.   Immunizations: (TDAP, Hep C screen, Pneumovax, Influenza, zoster)  Health Maintenance  Topic Date Due  . HIV Screening  08/31/1991  . Tetanus Vaccine  08/31/1995  . Flu Shot  01/28/2017  . Pap Smear  04/06/2017   Diet:regular Weight:  Wt Readings from Last 3 Encounters:  10/28/16 282 lb 1.3 oz (128 kg)  05/18/13 257 lb (116.6 kg)  03/09/13 264 lb 3.2 oz (119.8 kg)   Exercise:none Fall Risk: Fall Risk  10/28/2016  Falls in the past year? No   Home Safety:home with husband, children (71, 65, 70, 70yrs old) and mother in law  Depression/Suicide: Depression screen PHQ 2/9 10/28/2016  Decreased Interest 2  Down, Depressed, Hopeless 2  PHQ - 2 Score 4  Altered sleeping 2  Tired, decreased energy 3  Change in appetite 2  Feeling bad or failure about yourself  1  Trouble concentrating 1  Moving slowly or fidgety/restless 0  Suicidal thoughts 0  PHQ-9 Score 13  Difficult doing work/chores Somewhat difficult   Pap Smear (every 53yrs for >21-29 without HPV, every 79yrs for >30-26yrs with HPV): [facility],  last PAP 55months ago (normal per patient). Mammogram (yearly, >71yrs): needed, done by GYN  Advanced Directive: Advanced Directives 05/18/2013  Does Patient Have a Medical Advance Directive? Patient does not have advance directive;Patient would not like information   Sexual History (birth control, marital status, STD):married, sexually active, s/p tubal ligation.  Medications and allergies reviewed with patient and updated if appropriate.  Patient Active Problem List   Diagnosis Date Noted  . Vitamin D deficiency 10/28/2016  . Magnesium deficiency 10/28/2016  . Depression 10/28/2016  . Insomnia 10/28/2016  . HTN (hypertension), benign 10/28/2016  . Gastroesophageal reflux disease with esophagitis 10/28/2016  . Status post dilation of esophageal narrowing 10/28/2016    Current Outpatient Prescriptions on File Prior to Visit  Medication Sig Dispense Refill  . acetaminophen (TYLENOL) 500 MG tablet Take 1,000 mg by mouth every 6 (six) hours as needed. Pain    . Multiple Vitamin (MULTIVITAMIN WITH MINERALS) TABS Take 1 tablet by mouth daily.     No current facility-administered medications on file prior to visit.     Past Medical History:  Diagnosis Date  . Arthritis   . GERD (gastroesophageal reflux disease)   . Hypertension     Past Surgical History:  Procedure Laterality Date  . BREATH TEK H PYLORI N/A 03/04/2013   Procedure: BREATH TEK H PYLORI;  Surgeon: Madilyn Hook, DO;  Location: [facility];  Service: Endoscopy;  Laterality: N/A;  . CESAREAN SECTION  10/24/98,08/05/05,01/18/07,01/21/10   x4  . CHOLECYSTECTOMY  03/2007  . TONSILECTOMY/ADENOIDECTOMY WITH MYRINGOTOMY  1983  . TUBAL LIGATION      Social History   Social History  . Marital status: Married    Spouse name: N/A  . Number of children: N/A  . Years of education: N/A   Social History Main Topics  . Smoking status: Never Smoker  . Smokeless tobacco: Never Used  . Alcohol use Yes     Comment: social  .  Drug use: No  . Sexual activity: Yes    Birth control/ protection: Surgical     Comment: tubal ligation   Other Topics Concern  . Not on file   Social History Narrative  . No narrative on file    Family History  Problem Relation Age of Onset  . Heart disease Mother 42  . Arthritis Mother     OA  . Hyperlipidemia Mother   . Depression Mother   . Anxiety disorder Mother   . Mental illness Mother   . Suicidality Mother   . COPD Father   . Alcohol abuse Father   . Deep vein thrombosis Father   . Hyperlipidemia Father   . Sudden death Father 15    homicide  . Breast cancer Maternal Grandmother   . Stroke Maternal Grandmother   . Deep vein thrombosis Maternal Grandmother   . Hypertension Maternal Grandmother   . Hyperlipidemia Maternal Grandmother   . Lung cancer Maternal Grandfather   . Hyperlipidemia Maternal Grandfather   . Hyperlipidemia Paternal Grandmother   . Hyperlipidemia Paternal Grandfather         Review of Systems  Constitutional: Positive for appetite change, fatigue and malaise/fatigue. Negative for fever and weight loss.  HENT: Negative for congestion and sore throat.   Eyes:       Negative for visual  changes  Respiratory: Negative for cough and shortness of breath.   Cardiovascular: Negative for chest pain, palpitations and leg swelling.  Gastrointestinal: Positive for heartburn. Negative for blood in stool, constipation and diarrhea.  Genitourinary: Negative for dysuria, frequency and urgency.  Musculoskeletal: Negative for falls, joint pain and myalgias.  Skin: Negative for rash.  Neurological: Negative for dizziness, sensory change and headaches.  Endo/Heme/Allergies: Does not bruise/bleed easily.  Psychiatric/Behavioral: Positive for depression and sleep disturbance. Negative for decreased concentration, substance abuse and suicidal ideas. The patient is nervous/anxious and has insomnia.     Objective:   Vitals:   10/28/16 1058  BP: 124/72    Pulse: 83  Temp: 97.9 F (36.6 C)    Body mass index is 47.67 kg/m.   Physical Examination:  Physical Exam  Constitutional: She is oriented to person, place, and time and well-developed, well-nourished, and in no distress. She is not irritable. No distress.  HENT:  Right Ear: External ear normal.  Left Ear: External ear normal.  Nose: Nose normal.  Mouth/Throat: Oropharynx is clear and moist. No oropharyngeal exudate.  Eyes: Conjunctivae and EOM are normal. Pupils are equal, round, and reactive to light. No scleral icterus.  Neck: Normal range of motion. Neck supple. No thyromegaly present.  Cardiovascular: Normal rate, normal heart sounds and intact distal pulses.   Pulmonary/Chest: Effort normal and breath sounds normal. She exhibits no tenderness.  Abdominal: Soft. Bowel sounds are normal. She exhibits no distension. There is no tenderness.  Musculoskeletal: Normal range of motion. She exhibits no edema or tenderness.  Lymphadenopathy:    She has no cervical adenopathy.  Neurological: She is alert and oriented to person, place, and time. Gait normal.  Skin: Skin is warm and dry.  Psychiatric: Affect and judgment normal.  Vitals reviewed.   ASSESSMENT and PLAN:  Billijo was seen today for new patient (initial visit).  Diagnoses and all orders for this visit:  HTN (hypertension), benign -     Basic metabolic panel; Future -     lisinopril-hydrochlorothiazide (PRINZIDE,ZESTORETIC) 10-12.5 MG tablet; Take 1 tablet by mouth at bedtime.  Vitamin D deficiency -     Vitamin D 1,25 dihydroxy; Future  Magnesium deficiency -     Magnesium; Future  Severe episode of recurrent major depressive disorder, without psychotic features ([place]) -     ALPRAZolam (XANAX) 0.5 MG tablet; Take 1 tablet (0.5 mg total) by mouth at bedtime as needed. Anxiety -     Discontinue: escitalopram (LEXAPRO) 20 MG tablet; Take 1.5 tablets (30 mg total) by mouth daily. -     escitalopram (LEXAPRO) 20  MG tablet; Take 1 tablet (20 mg total) by mouth daily. -     doxepin (SINEQUAN) 25 MG capsule; Take 1 capsule (25 mg total) by mouth at bedtime.  Insomnia due to other mental disorder -     ALPRAZolam (XANAX) 0.5 MG tablet; Take 1 tablet (0.5 mg total) by mouth at bedtime as needed. Anxiety -     doxepin (SINEQUAN) 25 MG capsule; Take 1 capsule (25 mg total) by mouth at bedtime.  Gastroesophageal reflux disease with esophagitis -     omeprazole (PRILOSEC) 20 MG capsule; Take 1 capsule (20 mg total) by mouth daily.    Gastroesophageal reflux disease with esophagitis Prolonged use of PPI. Consider switching to pepcid or zantac due to vitamin d and magnesium deficiency.  HTN (hypertension), benign controlled  Depression Maintain lexapro. Start doxepin. Wean off xanax: next prescription should be 12/28/2016 (20tabs  only)      Follow up: Return in about 2 weeks (around 11/11/2016) for depression and anxiety.  Wilfred Lacy, NP

## 2016-10-28 NOTE — Assessment & Plan Note (Signed)
controlled 

## 2016-10-31 LAB — VITAMIN D 1,25 DIHYDROXY
Vitamin D 1, 25 (OH)2 Total: 33 pg/mL (ref 18–72)
Vitamin D2 1, 25 (OH)2: <8 pg/mL
Vitamin D3 1, 25 (OH)2: 33 pg/mL

## 2016-11-10 ENCOUNTER — Ambulatory Visit (INDEPENDENT_AMBULATORY_CARE_PROVIDER_SITE_OTHER): Payer: 59 | Admitting: Nurse Practitioner

## 2016-11-10 ENCOUNTER — Encounter: Payer: Self-pay | Admitting: Nurse Practitioner

## 2016-11-10 VITALS — BP 124/76 | HR 75 | Temp 98.2°F | Ht 64.5 in | Wt 282.0 lb

## 2016-11-10 DIAGNOSIS — F99 Mental disorder, not otherwise specified: Secondary | ICD-10-CM | POA: Diagnosis not present

## 2016-11-10 DIAGNOSIS — I10 Essential (primary) hypertension: Secondary | ICD-10-CM

## 2016-11-10 DIAGNOSIS — F332 Major depressive disorder, recurrent severe without psychotic features: Secondary | ICD-10-CM | POA: Diagnosis not present

## 2016-11-10 DIAGNOSIS — F5105 Insomnia due to other mental disorder: Secondary | ICD-10-CM | POA: Diagnosis not present

## 2016-11-10 MED ORDER — DOXEPIN HCL 25 MG PO CAPS: 25.0000 mg | ORAL_CAPSULE | Freq: Every day | ORAL | 3 refills | Status: DC

## 2016-11-10 NOTE — Progress Notes (Signed)
Pre visit review using our clinic review tool, if applicable. No additional management support is needed unless otherwise documented below in the visit note. 

## 2016-11-10 NOTE — Patient Instructions (Addendum)
Continue current medications. Call office if fatigue worsens.  Doxepin capsules What is this medicine? DOXEPIN (DOX e pin) is used to treat depression and anxiety. This medicine may be used for other purposes; ask your health care provider or pharmacist if you have questions. COMMON BRAND NAME(S): Sinequan What should I tell my health care provider before I take this medicine? They need to know if you have any of these conditions: -bipolar disorder -difficulty passing urine -glaucoma -heart disease -if you frequently drink alcohol containing drinks -liver disease -lung or breathing disease, like asthma or sleep apnea -prostate trouble -schizophrenia -seizures -suicidal thoughts, plans, or attempt; a previous suicide attempt by you or a family member -an unusual or allergic reaction to doxepin, other medicines, foods, dyes, or preservatives -pregnant or trying to get pregnant -breast-feeding How should I use this medicine? Take this medicine by mouth with a glass of water. Follow the directions on the prescription label. Take your doses at regular intervals. Do not take your medicine more often than directed. Do not stop taking this medicine suddenly except upon the advice of your doctor. Stopping this medicine too quickly may cause serious side effects or your condition may worsen. A special MedGuide will be given to you by the pharmacist with each prescription and refill. Be sure to read this information carefully each time. Talk to your pediatrician regarding the use of this medicine in children. While this drug may be prescribed for children as young as 12 years for selected conditions, precautions do apply. Overdosage: If you think you have taken too much of this medicine contact a poison control center or emergency room at once. NOTE: This medicine is only for you. Do not share this medicine with others. What if I miss a dose? If you miss a dose, take it as soon as you can. If it  is almost time for your next dose, take only that dose. Do not take double or extra doses. What may interact with this medicine? Do not take this medicine with any of the following medications: -arsenic trioxide -certain medicines used to regulate abnormal heartbeat or to treat other heart conditions -cisapride -halofantrine -levomethadyl -linezolid -MAOIs like Carbex, Eldepryl, Marplan, Nardil, and Parnate -methylene blue -other medicines for mental depression -phenothiazines like perphenazine, thioridazine and chlorpromazine -pimozide -procarbazine -sparfloxacin -St. John's Wort -ziprasidone This medicine may also interact with the following medications: -cimetidine -tolazamide This list may not describe all possible interactions. Give your health care provider a list of all the medicines, herbs, non-prescription drugs, or dietary supplements you use. Also tell them if you smoke, drink alcohol, or use illegal drugs. Some items may interact with your medicine. What should I watch for while using this medicine? Visit your doctor or health care professional for regular checks on your progress. It can take several days before you feel the full effect of this medicine. If you have been taking this medicine regularly for some time, do not suddenly stop taking it. You must gradually reduce the dose or you may get severe side effects. Ask your doctor or health care professional for advice. Even after you stop taking this medicine it can still affect your body for several days. Patients and their families should watch out for new or worsening thoughts of suicide or depression. Also watch out for sudden changes in feelings such as feeling anxious, agitated, panicky, irritable, hostile, aggressive, impulsive, severely restless, overly excited and hyperactive, or not being able to sleep. If this happens, especially at the  beginning of treatment or after a change in dose, call your health care  professional. Dennis Bast may get drowsy or dizzy. Do not drive, use machinery, or do anything that needs mental alertness until you know how this medicine affects you. Do not stand or sit up quickly, especially if you are an older patient. This reduces the risk of dizzy or fainting spells. Alcohol may increase dizziness and drowsiness. Avoid alcoholic drinks. Do not treat yourself for coughs, colds, or allergies without asking your doctor or health care professional for advice. Some ingredients can increase possible side effects. Your mouth may get dry. Chewing sugarless gum or sucking hard candy, and drinking plenty of water may help. Contact your doctor if the problem does not go away or is severe. This medicine may cause dry eyes and blurred vision. If you wear contact lenses you may feel some discomfort. Lubricating drops may help. See your eye doctor if the problem does not go away or is severe. This medicine can make you more sensitive to the sun. Keep out of the sun. If you cannot avoid being in the sun, wear protective clothing and use sunscreen. Do not use sun lamps or tanning beds/booths. What side effects may I notice from receiving this medicine? Side effects that you should report to your doctor or health care professional as soon as possible: -allergic reactions like skin rash, itching or hives, swelling of the face, lips, or tongue -anxious -breathing problems -changes in vision -confusion -elevated mood, decreased need for sleep, racing thoughts, impulsive behavior -eye pain -fast, irregular heartbeat -feeling faint or lightheaded, falls -feeling agitated, angry, or irritable -fever with increased sweating -hallucination, loss of contact with reality -seizures -stiff muscles -suicidal thoughts or other mood changes -tingling, pain, or numbness in the feet or hands -trouble passing urine or change in the amount of urine -trouble sleeping -unusually weak or  tired -vomiting -yellowing of the eyes or skin Side effects that usually do not require medical attention (report to your doctor or health care professional if they continue or are bothersome): -change in sex drive or performance -change in appetite or weight -constipation -dizziness -dry mouth -nausea -tired -tremors -upset stomach This list may not describe all possible side effects. Call your doctor for medical advice about side effects. You may report side effects to FDA at 1-800-FDA-1088. Where should I keep my medicine? Keep out of the reach of children. Store at room temperature between 15 and 30 degrees C (59 and 86 degrees F). Throw away any unused medicine after the expiration date. NOTE: This sheet is a summary. It may not cover all possible information. If you have questions about this medicine, talk to your doctor, pharmacist, or health care provider.  2018 Elsevier/Gold Standard (2015-11-16 12:35:05)

## 2016-11-10 NOTE — Progress Notes (Signed)
Subjective:  Patient ID: [name], female    DOB: 01-Aug-1975  Age: 41 y.o. MRN: [id number]  CC: Follow-up (2 wk f/u---)  HPI Anxiety and Insomnia: Complains of fatigue with doxepin.  No edema. Sleep has improved. She will like to maintain medication at this time.  Outpatient Medications Prior to Visit  Medication Sig Dispense Refill  . acetaminophen (TYLENOL) 500 MG tablet Take 1,000 mg by mouth every 6 (six) hours as needed. Pain    . ALPRAZolam (XANAX) 0.5 MG tablet Take 1 tablet (0.5 mg total) by mouth at bedtime as needed. Anxiety 30 tablet 0  . escitalopram (LEXAPRO) 20 MG tablet Take 1 tablet (20 mg total) by mouth daily. 90 tablet 0  . lisinopril-hydrochlorothiazide (PRINZIDE,ZESTORETIC) 10-12.5 MG tablet Take 1 tablet by mouth at bedtime. 90 tablet 0  . Multiple Vitamin (MULTIVITAMIN WITH MINERALS) TABS Take 1 tablet by mouth daily.    Marland Kitchen omeprazole (PRILOSEC) 20 MG capsule Take 1 capsule (20 mg total) by mouth daily. 90 capsule 0  . doxepin (SINEQUAN) 25 MG capsule Take 1 capsule (25 mg total) by mouth at bedtime. 30 capsule 0   No facility-administered medications prior to visit.     ROS See HPI  Objective:  BP 124/76   Pulse 75   Temp 98.2 F (36.8 C)   Ht 5' 4.5" (1.638 m)   Wt 282 lb (127.9 kg)   SpO2 98%   BMI 47.66 kg/m   BP Readings from Last 3 Encounters:  11/10/16 124/76  10/28/16 124/72  05/18/13 137/78    Wt Readings from Last 3 Encounters:  11/10/16 282 lb (127.9 kg)  10/28/16 282 lb 1.3 oz (128 kg)  05/18/13 257 lb (116.6 kg)    Physical Exam  Constitutional: She is oriented to person, place, and time. No distress.  Neck: Normal range of motion. Neck supple. No thyromegaly present.  Cardiovascular: Normal rate and regular rhythm.   Pulmonary/Chest: Effort normal and breath sounds normal.  Abdominal: Soft. Bowel sounds are normal.  Musculoskeletal: She exhibits no edema.  Lymphadenopathy:    She has no cervical adenopathy.    Neurological: She is alert and oriented to person, place, and time.  Skin: Skin is warm and dry.  Vitals reviewed.   Lab Results  Component Value Date   WBC 9.2 05/18/2013   HGB 12.6 05/18/2013   HCT 39.3 05/18/2013   PLT 374 05/18/2013   GLUCOSE 104 (H) 10/28/2016   CHOL 138 02/22/2013   TRIG 152 (H) 02/22/2013   HDL 34 (L) 02/22/2013   LDLCALC 74 02/22/2013   ALT 14 02/22/2013   AST 11 02/22/2013   NA 138 10/28/2016   K 3.7 10/28/2016   CL 102 10/28/2016   CREATININE 0.51 10/28/2016   BUN 13 10/28/2016   CO2 30 10/28/2016   TSH 1.003 02/22/2013    Dg Chest 2 View  Result Date: 05/18/2013 CLINICAL DATA:  Preop evaluation, gastric sleeve procedure EXAM: CHEST  2 VIEW COMPARISON:  04/20/2012 FINDINGS: The heart size and mediastinal contours are within normal limits. Both lungs are clear. The visualized skeletal structures are unremarkable. IMPRESSION: No active cardiopulmonary disease. Electronically Signed   By: Margaree Mackintosh M.D.   On: 05/18/2013 10:25    Assessment & Plan:   Jalexa was seen today for follow-up.  Diagnoses and all orders for this visit:  HTN (hypertension), benign  Severe episode of recurrent major depressive disorder, without psychotic features ([place]) -     doxepin (  SINEQUAN) 25 MG capsule; Take 1 capsule (25 mg total) by mouth at bedtime.  Insomnia due to other mental disorder -     doxepin (SINEQUAN) 25 MG capsule; Take 1 capsule (25 mg total) by mouth at bedtime.   I am having Ms. Goodpasture maintain her acetaminophen, multivitamin with minerals, ALPRAZolam, lisinopril-hydrochlorothiazide, omeprazole, escitalopram, and doxepin.  Meds ordered this encounter  Medications  . doxepin (SINEQUAN) 25 MG capsule    Sig: Take 1 capsule (25 mg total) by mouth at bedtime.    Dispense:  30 capsule    Refill:  3    Order Specific Question:   Supervising Provider    Answer:   Cassandria Anger [[id number]]    Follow-up: Return in about 3 weeks (around  11/28/2016) for CPE (fasting, will need labs, no breast or PAP exam).  Wilfred Lacy, NP

## 2016-12-19 ENCOUNTER — Encounter: Payer: 59 | Admitting: Nurse Practitioner

## 2016-12-23 ENCOUNTER — Other Ambulatory Visit: Payer: Self-pay | Admitting: Nurse Practitioner

## 2016-12-23 DIAGNOSIS — F332 Major depressive disorder, recurrent severe without psychotic features: Secondary | ICD-10-CM

## 2016-12-23 MED FILL — OMEPRAZOLE 20 MG CAPSULE DR: 20 | 90 days supply | Qty: 90 | Fill #0

## 2016-12-23 NOTE — Telephone Encounter (Signed)
Please verify direction on this med.   Pt pick up lexapro 20 mg with direction of taking 1 1/2 tab daily on 10/28/2016 thats 1 mo supply.   New rx we sent in with direction taking 1 tab daily 90 day supply on 10/28/2016.

## 2017-01-09 MED FILL — ESCITALOPRAM 20 MG TABLET: 20 | 90 days supply | Qty: 90 | Fill #0

## 2017-01-09 MED FILL — LISINOPRIL-HCTZ 10-12.5 MG: 10-12.5 | 90 days supply | Qty: 90 | Fill #0

## 2017-01-20 ENCOUNTER — Other Ambulatory Visit: Payer: Self-pay | Admitting: Nurse Practitioner

## 2017-01-20 DIAGNOSIS — F5105 Insomnia due to other mental disorder: Secondary | ICD-10-CM

## 2017-01-20 DIAGNOSIS — F332 Major depressive disorder, recurrent severe without psychotic features: Secondary | ICD-10-CM

## 2017-01-20 DIAGNOSIS — F99 Mental disorder, not otherwise specified: Secondary | ICD-10-CM

## 2017-01-20 NOTE — Telephone Encounter (Signed)
Please advise 

## 2017-01-27 ENCOUNTER — Ambulatory Visit: Payer: Self-pay | Admitting: Nurse Practitioner

## 2017-01-29 ENCOUNTER — Ambulatory Visit (INDEPENDENT_AMBULATORY_CARE_PROVIDER_SITE_OTHER): Payer: 59 | Admitting: Nurse Practitioner

## 2017-01-29 ENCOUNTER — Encounter: Payer: Self-pay | Admitting: Nurse Practitioner

## 2017-01-29 DIAGNOSIS — F332 Major depressive disorder, recurrent severe without psychotic features: Secondary | ICD-10-CM

## 2017-01-29 DIAGNOSIS — I10 Essential (primary) hypertension: Secondary | ICD-10-CM

## 2017-01-29 DIAGNOSIS — K2100 Gastro-esophageal reflux disease with esophagitis, without bleeding: Secondary | ICD-10-CM

## 2017-01-29 DIAGNOSIS — F99 Mental disorder, not otherwise specified: Secondary | ICD-10-CM | POA: Diagnosis not present

## 2017-01-29 DIAGNOSIS — F5105 Insomnia due to other mental disorder: Secondary | ICD-10-CM | POA: Diagnosis not present

## 2017-01-29 MED ORDER — OMEPRAZOLE 20 MG PO CPDR: 20.0000 mg | DELAYED_RELEASE_CAPSULE | Freq: Every day | ORAL | 0 refills | Status: DC

## 2017-01-29 MED ORDER — ESCITALOPRAM OXALATE 20 MG PO TABS: 20.0000 mg | ORAL_TABLET | Freq: Every day | ORAL | 0 refills | Status: DC

## 2017-01-29 MED ORDER — LISINOPRIL-HYDROCHLOROTHIAZIDE 10-12.5 MG PO TABS: 1.0000 | ORAL_TABLET | Freq: Every day | ORAL | 0 refills | Status: DC

## 2017-01-29 MED ORDER — ALPRAZOLAM 0.25 MG PO TABS: 0.2500 mg | ORAL_TABLET | Freq: Every evening | ORAL | 0 refills | Status: DC | PRN

## 2017-01-29 MED ORDER — ALPRAZOLAM 0.25 MG PO TABS: 0.2500 mg | ORAL_TABLET | Freq: Every evening | ORAL | 1 refills | Status: DC | PRN

## 2017-01-29 MED FILL — ALPRAZolam 0.25 MG TABS: 0.25 | 30 days supply | Qty: 30 | Fill #0

## 2017-01-29 NOTE — Progress Notes (Signed)
Subjective:  Patient ID: [name], female    DOB: 07-20-1975  Age: 41 y.o. MRN: [id number]  CC: Follow-up (folllow up--refill for xanaz and lexapro?)   HPI   Anxiety and Depression: Needs medications (lexapro and xanax) refilled. Has increased stress due to dying mother in law. States she is using xanax for anxiety, generalized pain and sleep. She is still not interested in psychotherapy at this time. Use of prozac, celexa and wellbutrin (medications stopped due to GI side effects).  Outpatient Medications Prior to Visit  Medication Sig Dispense Refill  . acetaminophen (TYLENOL) 500 MG tablet Take 1,000 mg by mouth every 6 (six) hours as needed. Pain    . Multiple Vitamin (MULTIVITAMIN WITH MINERALS) TABS Take 1 tablet by mouth daily.    Marland Kitchen ALPRAZolam (XANAX) 0.5 MG tablet Take 1 tablet (0.5 mg total) by mouth at bedtime as needed. Anxiety 30 tablet 0  . escitalopram (LEXAPRO) 20 MG tablet Take 1 tablet (20 mg total) by mouth daily. 30 tablet 0  . lisinopril-hydrochlorothiazide (PRINZIDE,ZESTORETIC) 10-12.5 MG tablet Take 1 tablet by mouth at bedtime. 90 tablet 0  . omeprazole (PRILOSEC) 20 MG capsule Take 1 capsule (20 mg total) by mouth daily. 90 capsule 0   No facility-administered medications prior to visit.     ROS See HPI  Objective:  BP (!) 140/98   Pulse 78   Temp 97.6 F (36.4 C)   Ht 5' 4.5" (1.638 m)   Wt 288 lb (130.6 kg)   SpO2 97%   BMI 48.67 kg/m   BP Readings from Last 3 Encounters:  01/29/17 (!) 140/98  11/10/16 124/76  10/28/16 124/72    Wt Readings from Last 3 Encounters:  01/29/17 288 lb (130.6 kg)  11/10/16 282 lb (127.9 kg)  10/28/16 282 lb 1.3 oz (128 kg)    Physical Exam  Constitutional: She is oriented to person, place, and time. No distress.  Cardiovascular: Normal rate, regular rhythm and normal heart sounds.   Pulmonary/Chest: Effort normal and breath sounds normal.  Musculoskeletal: She exhibits no edema.  Neurological:  She is alert and oriented to person, place, and time.  Psychiatric: Thought content normal.  Tearful when talking about her mother in [place] reviewed.   Lab Results  Component Value Date   WBC 9.2 05/18/2013   HGB 12.6 05/18/2013   HCT 39.3 05/18/2013   PLT 374 05/18/2013   GLUCOSE 104 (H) 10/28/2016   CHOL 138 02/22/2013   TRIG 152 (H) 02/22/2013   HDL 34 (L) 02/22/2013   LDLCALC 74 02/22/2013   ALT 14 02/22/2013   AST 11 02/22/2013   NA 138 10/28/2016   K 3.7 10/28/2016   CL 102 10/28/2016   CREATININE 0.51 10/28/2016   BUN 13 10/28/2016   CO2 30 10/28/2016   TSH 1.003 02/22/2013    Dg Chest 2 View  Result Date: 05/18/2013 CLINICAL DATA:  Preop evaluation, gastric sleeve procedure EXAM: CHEST  2 VIEW COMPARISON:  04/20/2012 FINDINGS: The heart size and mediastinal contours are within normal limits. Both lungs are clear. The visualized skeletal structures are unremarkable. IMPRESSION: No active cardiopulmonary disease. Electronically Signed   By: Margaree Mackintosh M.D.   On: 05/18/2013 10:25    Assessment & Plan:   [name] was seen today for follow-up.  Diagnoses and all orders for this visit:  Severe episode of recurrent major depressive disorder, without psychotic features ([place]) -     Discontinue: ALPRAZolam (XANAX) 0.25 MG tablet;  Take 1 tablet (0.25 mg total) by mouth at bedtime as needed. Anxiety -     escitalopram (LEXAPRO) 20 MG tablet; Take 1 tablet (20 mg total) by mouth daily. -     ALPRAZolam (XANAX) 0.25 MG tablet; Take 1 tablet (0.25 mg total) by mouth at bedtime as needed. Anxiety  Insomnia due to other mental disorder -     Discontinue: ALPRAZolam (XANAX) 0.25 MG tablet; Take 1 tablet (0.25 mg total) by mouth at bedtime as needed. Anxiety -     ALPRAZolam (XANAX) 0.25 MG tablet; Take 1 tablet (0.25 mg total) by mouth at bedtime as needed. Anxiety  HTN (hypertension), benign -     lisinopril-hydrochlorothiazide (PRINZIDE,ZESTORETIC) 10-12.5  MG tablet; Take 1 tablet by mouth at bedtime.  Gastroesophageal reflux disease with esophagitis -     omeprazole (PRILOSEC) 20 MG capsule; Take 1 capsule (20 mg total) by mouth daily.   I am having [name] maintain her acetaminophen, multivitamin with minerals, escitalopram, lisinopril-hydrochlorothiazide, omeprazole, and ALPRAZolam.  Meds ordered this encounter  Medications  . DISCONTD: ALPRAZolam (XANAX) 0.25 MG tablet    Sig: Take 1 tablet (0.25 mg total) by mouth at bedtime as needed. Anxiety    Dispense:  30 tablet    Refill:  0    Order Specific Question:   Supervising Provider    Answer:   Cassandria Anger [[id number]]  . escitalopram (LEXAPRO) 20 MG tablet    Sig: Take 1 tablet (20 mg total) by mouth daily.    Dispense:  30 tablet    Refill:  0    Order Specific Question:   Supervising Provider    Answer:   Cassandria Anger [[id number]]  . lisinopril-hydrochlorothiazide (PRINZIDE,ZESTORETIC) 10-12.5 MG tablet    Sig: Take 1 tablet by mouth at bedtime.    Dispense:  90 tablet    Refill:  0    Order Specific Question:   Supervising Provider    Answer:   Cassandria Anger [[id number]]  . omeprazole (PRILOSEC) 20 MG capsule    Sig: Take 1 capsule (20 mg total) by mouth daily.    Dispense:  90 capsule    Refill:  0    Order Specific Question:   Supervising Provider    Answer:   Cassandria Anger [[id number]]  . ALPRAZolam (XANAX) 0.25 MG tablet    Sig: Take 1 tablet (0.25 mg total) by mouth at bedtime as needed. Anxiety    Dispense:  30 tablet    Refill:  1    Order Specific Question:   Supervising Provider    Answer:   Cassandria Anger [[id number]]    Follow-up: Return in about 3 months (around 05/01/2017) for CPE (fasting for labs).  Wilfred Lacy, NP

## 2017-01-29 NOTE — Patient Instructions (Addendum)
Xanax dose has been decreased to 0.25mg . Goal is to wean off xanax. Consider switching to klonopin (long acting benzodiazepine) or restoril We need to discuss possible use of cymbalta, or viibryd or trintellix in 30months.

## 2017-01-29 NOTE — Assessment & Plan Note (Signed)
She stopped doxepin due to side effects (increased somnolence). No SI or HI. Continue lexapro and xanax for now. Concerned about xanax dependence? Xanax dose has been decreased to 0.25mg , 00000 Goal is to wean off xanax. Consider switching to klonopin (long acting benzodiazepine) or restoril We need to discuss possible use of cymbalta, or viibryd or trintellix in 48months.

## 2017-02-03 ENCOUNTER — Encounter: Payer: Self-pay | Admitting: Nurse Practitioner

## 2017-02-03 ENCOUNTER — Ambulatory Visit (INDEPENDENT_AMBULATORY_CARE_PROVIDER_SITE_OTHER): Payer: 59 | Admitting: Nurse Practitioner

## 2017-02-03 VITALS — BP 122/80 | HR 93 | Temp 98.9°F | Ht 64.5 in | Wt 290.0 lb

## 2017-02-03 DIAGNOSIS — J9801 Acute bronchospasm: Secondary | ICD-10-CM | POA: Diagnosis not present

## 2017-02-03 DIAGNOSIS — J0140 Acute pansinusitis, unspecified: Secondary | ICD-10-CM

## 2017-02-03 MED ORDER — AZITHROMYCIN 250 MG PO TABS: 250.0000 mg | ORAL_TABLET | Freq: Every day | ORAL | 0 refills | Status: DC

## 2017-02-03 MED ORDER — DM-GUAIFENESIN ER 30-600 MG PO TB12
1.0000 | ORAL_TABLET | Freq: Two times a day (BID) | ORAL | 0 refills | Status: DC | PRN
Start: 2017-02-03 — End: 2017-04-07

## 2017-02-03 MED ORDER — OXYMETAZOLINE HCL 0.05 % NA SOLN: 1.0000 | Freq: Two times a day (BID) | NASAL | 0 refills | Status: DC

## 2017-02-03 MED ORDER — BENZONATATE 100 MG PO CAPS: 100.0000 mg | ORAL_CAPSULE | Freq: Three times a day (TID) | ORAL | 0 refills | Status: DC | PRN

## 2017-02-03 MED ORDER — FLUTICASONE PROPIONATE 50 MCG/ACT NA SUSP: 2.0000 | Freq: Every day | NASAL | 0 refills | Status: DC

## 2017-02-03 MED FILL — FLUTICASONE PROP 50 MCG SPR: 50 | 30 days supply | Qty: 16 | Fill #0

## 2017-02-03 MED FILL — AZITHROMYCIN 250 MG TAB: 250 | 5 days supply | Qty: 6 | Fill #0

## 2017-02-03 MED FILL — BENZONATATE 100 MG CAP: 100 | 6 days supply | Qty: 20 | Fill #0

## 2017-02-03 NOTE — Patient Instructions (Signed)

## 2017-02-03 NOTE — Progress Notes (Signed)
Subjective:  Patient ID: [name], female    DOB: 1976-01-31  Age: 41 y.o. MRN: [id number]  CC: Nasal Congestion (congestion,coughing,wheezing,SOB going on for 6 days. )   URI   This is a new problem. The current episode started in the past 7 days. The problem has been gradually worsening. Associated symptoms include congestion, coughing, headaches, a plugged ear sensation, rhinorrhea, sinus pain, sneezing, a sore throat, swollen glands and wheezing. She has tried acetaminophen, NSAIDs and inhaler use for the symptoms. The treatment provided mild relief.    Outpatient Medications Prior to Visit  Medication Sig Dispense Refill  . acetaminophen (TYLENOL) 500 MG tablet Take 1,000 mg by mouth every 6 (six) hours as needed. Pain    . ALPRAZolam (XANAX) 0.25 MG tablet Take 1 tablet (0.25 mg total) by mouth at bedtime as needed. Anxiety 30 tablet 1  . escitalopram (LEXAPRO) 20 MG tablet Take 1 tablet (20 mg total) by mouth daily. 30 tablet 0  . lisinopril-hydrochlorothiazide (PRINZIDE,ZESTORETIC) 10-12.5 MG tablet Take 1 tablet by mouth at bedtime. 90 tablet 0  . Multiple Vitamin (MULTIVITAMIN WITH MINERALS) TABS Take 1 tablet by mouth daily.    Marland Kitchen omeprazole (PRILOSEC) 20 MG capsule Take 1 capsule (20 mg total) by mouth daily. 90 capsule 0   No facility-administered medications prior to visit.     ROS See HPI  Objective:  BP 122/80   Pulse 93   Temp 98.9 F (37.2 C)   Ht 5' 4.5" (1.638 m)   Wt 290 lb (131.5 kg)   SpO2 95%   BMI 49.01 kg/m   BP Readings from Last 3 Encounters:  02/03/17 122/80  01/29/17 (!) 140/98  11/10/16 124/76    Wt Readings from Last 3 Encounters:  02/03/17 290 lb (131.5 kg)  01/29/17 288 lb (130.6 kg)  11/10/16 282 lb (127.9 kg)    Physical Exam  Constitutional: She is oriented to person, place, and time.  HENT:  Right Ear: Tympanic membrane, external ear and ear canal normal.  Left Ear: Tympanic membrane, external ear and ear canal normal.   Nose: Mucosal edema and rhinorrhea present. Right sinus exhibits no maxillary sinus tenderness and no frontal sinus tenderness. Left sinus exhibits no maxillary sinus tenderness and no frontal sinus tenderness.  Mouth/Throat: Uvula is midline. No trismus in the jaw. Posterior oropharyngeal erythema present. No oropharyngeal exudate.  Eyes: No scleral icterus.  Neck: Normal range of motion. Neck supple.  Cardiovascular: Normal rate and normal heart sounds.   Pulmonary/Chest: Effort normal and breath sounds normal. No respiratory distress. She has no wheezes. She has no rales.  Musculoskeletal: She exhibits no edema.  Lymphadenopathy:    She has no cervical adenopathy.  Neurological: She is alert and oriented to person, place, and time.  Skin: Skin is warm and dry. No erythema.  Vitals reviewed.   Lab Results  Component Value Date   WBC 9.2 05/18/2013   HGB 12.6 05/18/2013   HCT 39.3 05/18/2013   PLT 374 05/18/2013   GLUCOSE 104 (H) 10/28/2016   CHOL 138 02/22/2013   TRIG 152 (H) 02/22/2013   HDL 34 (L) 02/22/2013   LDLCALC 74 02/22/2013   ALT 14 02/22/2013   AST 11 02/22/2013   NA 138 10/28/2016   K 3.7 10/28/2016   CL 102 10/28/2016   CREATININE 0.51 10/28/2016   BUN 13 10/28/2016   CO2 30 10/28/2016   TSH 1.003 02/22/2013    Dg Chest 2 View  Result Date:  05/18/2013 CLINICAL DATA:  Preop evaluation, gastric sleeve procedure EXAM: CHEST  2 VIEW COMPARISON:  04/20/2012 FINDINGS: The heart size and mediastinal contours are within normal limits. Both lungs are clear. The visualized skeletal structures are unremarkable. IMPRESSION: No active cardiopulmonary disease. Electronically Signed   By: Margaree Mackintosh M.D.   On: 05/18/2013 10:25    Assessment & Plan:   Alga was seen today for nasal congestion.  Diagnoses and all orders for this visit:  Acute non-recurrent pansinusitis -     dextromethorphan-guaiFENesin (MUCINEX DM) 30-600 MG 12hr tablet; Take 1 tablet by mouth 2  (two) times daily as needed for cough. -     azithromycin (ZITHROMAX Z-PAK) 250 MG tablet; Take 1 tablet (250 mg total) by mouth daily. Take 2tabs on first day, then 1tab once a day till complete -     fluticasone (FLONASE) 50 MCG/ACT nasal spray; Place 2 sprays into both nostrils daily. -     oxymetazoline (AFRIN NASAL SPRAY) 0.05 % nasal spray; Place 1 spray into both nostrils 2 (two) times daily. Use only for 3days, then stop -     benzonatate (TESSALON) 100 MG capsule; Take 1 capsule (100 mg total) by mouth 3 (three) times daily as needed for cough.  Acute bronchospasm -     dextromethorphan-guaiFENesin (MUCINEX DM) 30-600 MG 12hr tablet; Take 1 tablet by mouth 2 (two) times daily as needed for cough. -     azithromycin (ZITHROMAX Z-PAK) 250 MG tablet; Take 1 tablet (250 mg total) by mouth daily. Take 2tabs on first day, then 1tab once a day till complete -     fluticasone (FLONASE) 50 MCG/ACT nasal spray; Place 2 sprays into both nostrils daily. -     oxymetazoline (AFRIN NASAL SPRAY) 0.05 % nasal spray; Place 1 spray into both nostrils 2 (two) times daily. Use only for 3days, then stop -     benzonatate (TESSALON) 100 MG capsule; Take 1 capsule (100 mg total) by mouth 3 (three) times daily as needed for cough.   I am having [name] start on dextromethorphan-guaiFENesin, azithromycin, fluticasone, oxymetazoline, and benzonatate. I am also having her maintain her acetaminophen, multivitamin with minerals, escitalopram, lisinopril-hydrochlorothiazide, omeprazole, ALPRAZolam, and albuterol.  Meds ordered this encounter  Medications  . albuterol (PROVENTIL HFA;VENTOLIN HFA) 108 (90 Base) MCG/ACT inhaler    Sig: Inhale 2 puffs into the lungs every 6 (six) hours as needed for wheezing or shortness of breath.    Dispense:  1 Inhaler    Refill:  0    Order Specific Question:   Supervising Provider    Answer:   Cassandria Anger [[id number]]  . dextromethorphan-guaiFENesin (MUCINEX DM) 30-600  MG 12hr tablet    Sig: Take 1 tablet by mouth 2 (two) times daily as needed for cough.    Dispense:  14 tablet    Refill:  0    Order Specific Question:   Supervising Provider    Answer:   Cassandria Anger [[id number]]  . azithromycin (ZITHROMAX Z-PAK) 250 MG tablet    Sig: Take 1 tablet (250 mg total) by mouth daily. Take 2tabs on first day, then 1tab once a day till complete    Dispense:  6 tablet    Refill:  0    Order Specific Question:   Supervising Provider    Answer:   Cassandria Anger [[id number]]  . fluticasone (FLONASE) 50 MCG/ACT nasal spray    Sig: Place 2 sprays into both nostrils daily.  Dispense:  16 g    Refill:  0    Order Specific Question:   Supervising Provider    Answer:   Cassandria Anger [[id number]]  . oxymetazoline (AFRIN NASAL SPRAY) 0.05 % nasal spray    Sig: Place 1 spray into both nostrils 2 (two) times daily. Use only for 3days, then stop    Dispense:  30 mL    Refill:  0    Order Specific Question:   Supervising Provider    Answer:   Cassandria Anger [[id number]]  . benzonatate (TESSALON) 100 MG capsule    Sig: Take 1 capsule (100 mg total) by mouth 3 (three) times daily as needed for cough.    Dispense:  20 capsule    Refill:  0    Order Specific Question:   Supervising Provider    Answer:   Cassandria Anger [[id number]]    Follow-up: Return if symptoms worsen or fail to improve.  Wilfred Lacy, NP

## 2017-02-24 ENCOUNTER — Other Ambulatory Visit (INDEPENDENT_AMBULATORY_CARE_PROVIDER_SITE_OTHER): Payer: 59

## 2017-02-24 ENCOUNTER — Encounter: Payer: Self-pay | Admitting: Nurse Practitioner

## 2017-02-24 ENCOUNTER — Ambulatory Visit (INDEPENDENT_AMBULATORY_CARE_PROVIDER_SITE_OTHER): Payer: 59 | Admitting: Nurse Practitioner

## 2017-02-24 VITALS — BP 146/86 | HR 87 | Temp 98.2°F | Ht 64.5 in | Wt 288.0 lb

## 2017-02-24 DIAGNOSIS — Z9851 Tubal ligation status: Secondary | ICD-10-CM | POA: Insufficient documentation

## 2017-02-24 DIAGNOSIS — R1012 Left upper quadrant pain: Secondary | ICD-10-CM | POA: Diagnosis not present

## 2017-02-24 LAB — POCT URINALYSIS DIPSTICK
BILIRUBIN UA: NEGATIVE
Blood, UA: NEGATIVE
Glucose, UA: NEGATIVE
KETONES UA: NEGATIVE
Leukocytes, UA: NEGATIVE
Nitrite, UA: NEGATIVE
PH UA: 6 (ref 5.0–8.0)
PROTEIN UA: NEGATIVE
Urobilinogen, UA: 0.2 E.U./dL

## 2017-02-24 LAB — CBC WITH DIFFERENTIAL/PLATELET
Basophils Absolute: 0.1 10*3/uL (ref 0.0–0.1)
Basophils Relative: 0.7 % (ref 0.0–3.0)
EOS ABS: 0.1 10*3/uL (ref 0.0–0.7)
EOS PCT: 1.3 % (ref 0.0–5.0)
HCT: 35.6 % — ABNORMAL LOW (ref 36.0–46.0)
HEMOGLOBIN: 11.6 g/dL — AB (ref 12.0–15.0)
LYMPHS PCT: 29.3 % (ref 12.0–46.0)
Lymphs Abs: 3.1 10*3/uL (ref 0.7–4.0)
MCHC: 32.6 g/dL (ref 30.0–36.0)
MCV: 77.9 fl — ABNORMAL LOW (ref 78.0–100.0)
MONO ABS: 0.5 10*3/uL (ref 0.1–1.0)
Monocytes Relative: 4.5 % (ref 3.0–12.0)
NEUTROS ABS: 6.9 10*3/uL (ref 1.4–7.7)
Neutrophils Relative %: 64.2 % (ref 43.0–77.0)
Platelets: 456 10*3/uL — ABNORMAL HIGH (ref 150.0–400.0)
RBC: 4.57 Mil/uL (ref 3.87–5.11)
RDW: 17.9 % — AB (ref 11.5–15.5)
WBC: 10.7 10*3/uL — ABNORMAL HIGH (ref 4.0–10.5)

## 2017-02-24 LAB — AMYLASE: Amylase: 20 U/L — ABNORMAL LOW (ref 27–131)

## 2017-02-24 LAB — LIPASE: Lipase: 9 U/L — ABNORMAL LOW (ref 11.0–59.0)

## 2017-02-24 NOTE — Patient Instructions (Addendum)
Low level of amylase and lipase, hence low probability of acute pancreatitis. Stable cbc. No need to additional testing at this time. Use tylenol and/or ibuprofen for pain  Abdominal Pain, Adult Many things can cause belly (abdominal) pain. Most times, belly pain is not dangerous. Many cases of belly pain can be watched and treated at home. Sometimes belly pain is serious, though. Your doctor will try to find the cause of your belly pain. Follow these instructions at home:  Take over-the-counter and prescription medicines only as told by your doctor. Do not take medicines that help you poop (laxatives) unless told to by your doctor.  Drink enough fluid to keep your pee (urine) clear or pale yellow.  Watch your belly pain for any changes.  Keep all follow-up visits as told by your doctor. This is important. Contact a doctor if:  Your belly pain changes or gets worse.  You are not hungry, or you lose weight without trying.  You are having trouble pooping (constipated) or have watery poop (diarrhea) for more than 2-3 days.  You have pain when you pee or poop.  Your belly pain wakes you up at night.  Your pain gets worse with meals, after eating, or with certain foods.  You are throwing up and cannot keep anything down.  You have a fever. Get help right away if:  Your pain does not go away as soon as your doctor says it should.  You cannot stop throwing up.  Your pain is only in areas of your belly, such as the right side or the left lower part of the belly.  You have bloody or black poop, or poop that looks like tar.  You have very bad pain, cramping, or bloating in your belly.  You have signs of not having enough fluid or water in your body (dehydration), such as: ? Dark pee, very little pee, or no pee. ? Cracked lips. ? Dry mouth. ? Sunken eyes. ? Sleepiness. ? Weakness. This information is not intended to replace advice given to you by your health care provider.  Make sure you discuss any questions you have with your health care provider. Document Released: 12/03/2007 Document Revised: 01/04/2016 Document Reviewed: 11/28/2015 Elsevier Interactive Patient Education  2017 [employer].

## 2017-02-24 NOTE — Progress Notes (Signed)
Subjective:  Patient ID: [name], female    DOB: 1976/02/22  Age: 41 y.o. MRN: [id number]  CC: Back Pain (left back pain going toward front going on for 1 wk. )   Flank Pain  This is a new problem. The current episode started in the past 7 days. The problem occurs constantly. The problem has been gradually worsening since onset. Pain location: left flank and LUQ ABD pain. The quality of the pain is described as aching (dull aching). The pain does not radiate. The symptoms are aggravated by bending, sitting and twisting. Associated symptoms include abdominal pain. Pertinent negatives include no bladder incontinence, bowel incontinence, dysuria, fever, numbness, pelvic pain, tingling or weight loss. (No N/V/D, no constipation, no rash) Risk factors include poor posture, sedentary lifestyle and obesity. She has tried nothing for the symptoms.  s/p cholecystectomy. Denies any injury  Outpatient Medications Prior to Visit  Medication Sig Dispense Refill  . acetaminophen (TYLENOL) 500 MG tablet Take 1,000 mg by mouth every 6 (six) hours as needed. Pain    . albuterol (PROVENTIL HFA;VENTOLIN HFA) 108 (90 Base) MCG/ACT inhaler Inhale 2 puffs into the lungs every 6 (six) hours as needed for wheezing or shortness of breath. 1 Inhaler 0  . ALPRAZolam (XANAX) 0.25 MG tablet Take 1 tablet (0.25 mg total) by mouth at bedtime as needed. Anxiety 30 tablet 1  . azithromycin (ZITHROMAX Z-PAK) 250 MG tablet Take 1 tablet (250 mg total) by mouth daily. Take 2tabs on first day, then 1tab once a day till complete 6 tablet 0  . benzonatate (TESSALON) 100 MG capsule Take 1 capsule (100 mg total) by mouth 3 (three) times daily as needed for cough. 20 capsule 0  . dextromethorphan-guaiFENesin (MUCINEX DM) 30-600 MG 12hr tablet Take 1 tablet by mouth 2 (two) times daily as needed for cough. 14 tablet 0  . escitalopram (LEXAPRO) 20 MG tablet Take 1 tablet (20 mg total) by mouth daily. 30 tablet 0  . fluticasone  (FLONASE) 50 MCG/ACT nasal spray Place 2 sprays into both nostrils daily. 16 g 0  . lisinopril-hydrochlorothiazide (PRINZIDE,ZESTORETIC) 10-12.5 MG tablet Take 1 tablet by mouth at bedtime. 90 tablet 0  . Multiple Vitamin (MULTIVITAMIN WITH MINERALS) TABS Take 1 tablet by mouth daily.    Marland Kitchen omeprazole (PRILOSEC) 20 MG capsule Take 1 capsule (20 mg total) by mouth daily. 90 capsule 0  . oxymetazoline (AFRIN NASAL SPRAY) 0.05 % nasal spray Place 1 spray into both nostrils 2 (two) times daily. Use only for 3days, then stop 30 mL 0   No facility-administered medications prior to visit.     ROS See HPI  Objective:  BP (!) 146/86   Pulse 87   Temp 98.2 F (36.8 C)   Ht 5' 4.5" (1.638 m)   Wt 288 lb (130.6 kg)   SpO2 96%   BMI 48.67 kg/m   BP Readings from Last 3 Encounters:  02/24/17 (!) 146/86  02/03/17 122/80  01/29/17 (!) 140/98    Wt Readings from Last 3 Encounters:  02/24/17 288 lb (130.6 kg)  02/03/17 290 lb (131.5 kg)  01/29/17 288 lb (130.6 kg)    Physical Exam  Abdominal: Soft. Bowel sounds are normal. She exhibits no distension. There is tenderness. There is no rebound.  Negative CVA tenderness    Lab Results  Component Value Date   WBC 10.7 (H) 02/24/2017   HGB 11.6 (L) 02/24/2017   HCT 35.6 (L) 02/24/2017   PLT 456.0 (H) 02/24/2017  GLUCOSE 104 (H) 10/28/2016   CHOL 138 02/22/2013   TRIG 152 (H) 02/22/2013   HDL 34 (L) 02/22/2013   LDLCALC 74 02/22/2013   ALT 14 02/22/2013   AST 11 02/22/2013   NA 138 10/28/2016   K 3.7 10/28/2016   CL 102 10/28/2016   CREATININE 0.51 10/28/2016   BUN 13 10/28/2016   CO2 30 10/28/2016   TSH 1.003 02/22/2013    Dg Chest 2 View  Result Date: 05/18/2013 CLINICAL DATA:  Preop evaluation, gastric sleeve procedure EXAM: CHEST  2 VIEW COMPARISON:  04/20/2012 FINDINGS: The heart size and mediastinal contours are within normal limits. Both lungs are clear. The visualized skeletal structures are unremarkable. IMPRESSION:  No active cardiopulmonary disease. Electronically Signed   By: Margaree Mackintosh M.D.   On: 05/18/2013 10:25    Assessment & Plan:   [name] was seen today for back pain.  Diagnoses and all orders for this visit:  Acute LUQ pain -     POCT urinalysis dipstick -     Lipase; Future -     Amylase; Future -     CBC w/Diff; Future   I am having [name] maintain her acetaminophen, multivitamin with minerals, escitalopram, lisinopril-hydrochlorothiazide, omeprazole, ALPRAZolam, albuterol, dextromethorphan-guaiFENesin, azithromycin, fluticasone, oxymetazoline, and benzonatate.  No orders of the defined types were placed in this encounter.   Follow-up: No Follow-up on file.  Wilfred Lacy, NP

## 2017-03-03 MED FILL — ALPRAZolam 0.25 MG TABS: 0.25 | 30 days supply | Qty: 30 | Fill #1

## 2017-03-25 DIAGNOSIS — Z01419 Encounter for gynecological examination (general) (routine) without abnormal findings: Secondary | ICD-10-CM | POA: Diagnosis not present

## 2017-03-25 DIAGNOSIS — Z1231 Encounter for screening mammogram for malignant neoplasm of breast: Secondary | ICD-10-CM | POA: Diagnosis not present

## 2017-03-25 DIAGNOSIS — Z6841 Body Mass Index (BMI) 40.0 and over, adult: Secondary | ICD-10-CM | POA: Diagnosis not present

## 2017-04-07 ENCOUNTER — Encounter: Payer: Self-pay | Admitting: Nurse Practitioner

## 2017-04-07 ENCOUNTER — Ambulatory Visit (INDEPENDENT_AMBULATORY_CARE_PROVIDER_SITE_OTHER): Payer: 59 | Admitting: Nurse Practitioner

## 2017-04-07 VITALS — BP 144/88 | HR 84 | Temp 98.0°F | Ht 64.5 in | Wt 292.0 lb

## 2017-04-07 DIAGNOSIS — F5104 Psychophysiologic insomnia: Secondary | ICD-10-CM

## 2017-04-07 DIAGNOSIS — F332 Major depressive disorder, recurrent severe without psychotic features: Secondary | ICD-10-CM | POA: Diagnosis not present

## 2017-04-07 MED ORDER — BUPROPION HCL ER (XL) 150 MG PO TB24: 150.0000 mg | ORAL_TABLET | Freq: Every day | ORAL | 0 refills | Status: DC

## 2017-04-07 MED ORDER — TEMAZEPAM 15 MG PO CAPS: 15.0000 mg | ORAL_CAPSULE | Freq: Every evening | ORAL | 0 refills | Status: DC | PRN

## 2017-04-07 MED FILL — TEMAZEPAM 15 MG CAPSULE: 15 | 30 days supply | Qty: 30 | Fill #0

## 2017-04-07 MED FILL — buPROPion HCL ER (XL) 150 M: 150 | 30 days supply | Qty: 30 | Fill #0

## 2017-04-07 NOTE — Progress Notes (Signed)
Subjective:  Patient ID: [name], female    DOB: 30-Jul-1975  Age: 41 y.o. MRN: [id number]  CC: Medication Refill (antidepression med consult: mother in law just pass--going through though time. )   Anxiety  Presents for follow-up visit. Symptoms include decreased concentration, depressed mood, excessive worry, insomnia, irritability, muscle tension, nervous/anxious behavior and restlessness. Patient reports no suicidal ideas. Symptoms occur constantly. The severity of symptoms is causing significant distress and interfering with daily activities. The quality of sleep is fair. Nighttime awakenings: occasional.   Compliance with medications is 76-100%.  she will like to try wellbutrin Worsening mood due to recent demise of mother in law. She is not interested in counseling at this time.  Unable to tolerate trazodone. Weaned off xanax with no adverse effects.  Outpatient Medications Prior to Visit  Medication Sig Dispense Refill  . acetaminophen (TYLENOL) 500 MG tablet Take 1,000 mg by mouth every 6 (six) hours as needed. Pain    . albuterol (PROVENTIL HFA;VENTOLIN HFA) 108 (90 Base) MCG/ACT inhaler Inhale 2 puffs into the lungs every 6 (six) hours as needed for wheezing or shortness of breath. 1 Inhaler 0  . escitalopram (LEXAPRO) 20 MG tablet Take 1 tablet (20 mg total) by mouth daily. 30 tablet 0  . lisinopril-hydrochlorothiazide (PRINZIDE,ZESTORETIC) 10-12.5 MG tablet Take 1 tablet by mouth at bedtime. 90 tablet 0  . Multiple Vitamin (MULTIVITAMIN WITH MINERALS) TABS Take 1 tablet by mouth daily.    Marland Kitchen omeprazole (PRILOSEC) 20 MG capsule Take 1 capsule (20 mg total) by mouth daily. 90 capsule 0  . ALPRAZolam (XANAX) 0.25 MG tablet Take 1 tablet (0.25 mg total) by mouth at bedtime as needed. Anxiety 30 tablet 1  . benzonatate (TESSALON) 100 MG capsule Take 1 capsule (100 mg total) by mouth 3 (three) times daily as needed for cough. (Patient not taking: Reported on 04/07/2017) 20  capsule 0  . azithromycin (ZITHROMAX Z-PAK) 250 MG tablet Take 1 tablet (250 mg total) by mouth daily. Take 2tabs on first day, then 1tab once a day till complete (Patient not taking: Reported on 04/07/2017) 6 tablet 0  . dextromethorphan-guaiFENesin (MUCINEX DM) 30-600 MG 12hr tablet Take 1 tablet by mouth 2 (two) times daily as needed for cough. (Patient not taking: Reported on 04/07/2017) 14 tablet 0  . fluticasone (FLONASE) 50 MCG/ACT nasal spray Place 2 sprays into both nostrils daily. (Patient not taking: Reported on 04/07/2017) 16 g 0  . oxymetazoline (AFRIN NASAL SPRAY) 0.05 % nasal spray Place 1 spray into both nostrils 2 (two) times daily. Use only for 3days, then stop (Patient not taking: Reported on 04/07/2017) 30 mL 0   No facility-administered medications prior to visit.     ROS See HPI  Objective:  BP (!) 144/88   Pulse 84   Temp 98 F (36.7 C)   Ht 5' 4.5" (1.638 m)   Wt 292 lb (132.5 kg)   SpO2 97%   BMI 49.35 kg/m   BP Readings from Last 3 Encounters:  04/07/17 (!) 144/88  02/24/17 (!) 146/86  02/03/17 122/80    Wt Readings from Last 3 Encounters:  04/07/17 292 lb (132.5 kg)  02/24/17 288 lb (130.6 kg)  02/03/17 290 lb (131.5 kg)    Physical Exam  Constitutional: She is oriented to person, place, and time.  Cardiovascular: Normal rate.   Pulmonary/Chest: Effort normal.  Neurological: She is alert and oriented to person, place, and time.  Psychiatric: She has a normal mood and affect.  Her behavior is normal.  Vitals reviewed.   Lab Results  Component Value Date   WBC 10.7 (H) 02/24/2017   HGB 11.6 (L) 02/24/2017   HCT 35.6 (L) 02/24/2017   PLT 456.0 (H) 02/24/2017   GLUCOSE 104 (H) 10/28/2016   CHOL 138 02/22/2013   TRIG 152 (H) 02/22/2013   HDL 34 (L) 02/22/2013   LDLCALC 74 02/22/2013   ALT 14 02/22/2013   AST 11 02/22/2013   NA 138 10/28/2016   K 3.7 10/28/2016   CL 102 10/28/2016   CREATININE 0.51 10/28/2016   BUN 13 10/28/2016   CO2 30  10/28/2016   TSH 1.003 02/22/2013    Dg Chest 2 View  Result Date: 05/18/2013 CLINICAL DATA:  Preop evaluation, gastric sleeve procedure EXAM: CHEST  2 VIEW COMPARISON:  04/20/2012 FINDINGS: The heart size and mediastinal contours are within normal limits. Both lungs are clear. The visualized skeletal structures are unremarkable. IMPRESSION: No active cardiopulmonary disease. Electronically Signed   By: Margaree Mackintosh M.D.   On: 05/18/2013 10:25    Assessment & Plan:   Mekala was seen today for medication refill.  Diagnoses and all orders for this visit:  Severe episode of recurrent major depressive disorder, without psychotic features ([place]) -     buPROPion (WELLBUTRIN XL) 150 MG 24 hr tablet; Take 1 tablet (150 mg total) by mouth daily.  Psychophysiological insomnia -     temazepam (RESTORIL) 15 MG capsule; Take 1 capsule (15 mg total) by mouth at bedtime as needed for sleep.   I have discontinued Ms. Orsino's ALPRAZolam, dextromethorphan-guaiFENesin, azithromycin, fluticasone, and oxymetazoline. I am also having her start on buPROPion and temazepam. Additionally, I am having her maintain her acetaminophen, multivitamin with minerals, escitalopram, lisinopril-hydrochlorothiazide, omeprazole, albuterol, and benzonatate.  Meds ordered this encounter  Medications  . buPROPion (WELLBUTRIN XL) 150 MG 24 hr tablet    Sig: Take 1 tablet (150 mg total) by mouth daily.    Dispense:  30 tablet    Refill:  0    Order Specific Question:   Supervising Provider    Answer:   Cassandria Anger [[id number]]  . temazepam (RESTORIL) 15 MG capsule    Sig: Take 1 capsule (15 mg total) by mouth at bedtime as needed for sleep.    Dispense:  30 capsule    Refill:  0    Order Specific Question:   Supervising Provider    Answer:   Cassandria Anger [[id number]]    Follow-up: Return in about 4 weeks (around 05/05/2017) for  CPE (fasting) and insomnia and depression and anxiety.  Wilfred Lacy, NP

## 2017-04-08 NOTE — Patient Instructions (Signed)
Informed [name] about possible worsening insomnia with wellbutrin.

## 2017-04-08 NOTE — Assessment & Plan Note (Signed)
Unable to tolerate trazodone. Rx restoril given today

## 2017-04-08 NOTE — Assessment & Plan Note (Signed)
Wants to try lexapro and wellbutrin. She was made aware of potential side effect of wellbutrin.

## 2017-04-20 MED FILL — LISINOPRIL-HCTZ 10-12.5 MG: 10-12.5 | 90 days supply | Qty: 90 | Fill #0

## 2017-04-20 MED FILL — OMEPRAZOLE 20 MG CAP: 20 | 90 days supply | Qty: 90 | Fill #0

## 2017-04-20 MED FILL — ESCITALOPRAM 20 MG TABLET: 20 | 30 days supply | Qty: 30 | Fill #0

## 2017-05-07 ENCOUNTER — Encounter: Payer: Self-pay | Admitting: Nurse Practitioner

## 2017-05-11 ENCOUNTER — Encounter: Payer: Self-pay | Admitting: Nurse Practitioner

## 2017-06-11 MED FILL — ESCITALOPRAM 20 MG TABLET: 20 | 30 days supply | Qty: 30 | Fill #0

## 2017-06-19 ENCOUNTER — Encounter: Payer: Self-pay | Admitting: Nurse Practitioner

## 2017-06-19 ENCOUNTER — Ambulatory Visit (INDEPENDENT_AMBULATORY_CARE_PROVIDER_SITE_OTHER): Payer: 59 | Admitting: Nurse Practitioner

## 2017-06-19 VITALS — BP 130/82 | HR 99 | Temp 98.3°F | Ht 64.5 in | Wt 293.0 lb

## 2017-06-19 DIAGNOSIS — F411 Generalized anxiety disorder: Secondary | ICD-10-CM | POA: Diagnosis not present

## 2017-06-19 DIAGNOSIS — F332 Major depressive disorder, recurrent severe without psychotic features: Secondary | ICD-10-CM | POA: Diagnosis not present

## 2017-06-19 DIAGNOSIS — I10 Essential (primary) hypertension: Secondary | ICD-10-CM | POA: Diagnosis not present

## 2017-06-19 MED ORDER — ESCITALOPRAM OXALATE 20 MG PO TABS: 20.0000 mg | ORAL_TABLET | Freq: Every day | ORAL | 1 refills | Status: DC

## 2017-06-19 MED ORDER — ESCITALOPRAM OXALATE 20 MG PO TABS: 20.0000 mg | ORAL_TABLET | Freq: Every day | ORAL | 0 refills | Status: DC

## 2017-06-19 MED ORDER — LISINOPRIL-HYDROCHLOROTHIAZIDE 10-12.5 MG PO TABS: 1.0000 | ORAL_TABLET | Freq: Every day | ORAL | 1 refills | Status: DC

## 2017-06-19 MED ORDER — TRAZODONE HCL 50 MG PO TABS: 50.0000 mg | ORAL_TABLET | Freq: Every day | ORAL | 0 refills | Status: DC

## 2017-06-19 MED ORDER — LISINOPRIL-HYDROCHLOROTHIAZIDE 10-12.5 MG PO TABS: 1.0000 | ORAL_TABLET | Freq: Every day | ORAL | 0 refills | Status: DC

## 2017-06-19 MED FILL — traZODone HCL 50 MG TABS: 50 | 30 days supply | Qty: 30 | Fill #0

## 2017-06-19 NOTE — Patient Instructions (Addendum)
Start trazodone as prescribed.  F/up in 4weeks.  Trazodone tablets What is this medicine? TRAZODONE (TRAZ oh done) is used to treat depression. This medicine may be used for other purposes; ask your health care provider or pharmacist if you have questions. COMMON BRAND NAME(S): Desyrel What should I tell my health care provider before I take this medicine? They need to know if you have any of these conditions: -attempted suicide or thinking about it -bipolar disorder -bleeding problems -glaucoma -heart disease, or previous heart attack -irregular heart beat -kidney or liver disease -low levels of sodium in the blood -an unusual or allergic reaction to trazodone, other medicines, foods, dyes or preservatives -pregnant or trying to get pregnant -breast-feeding How should I use this medicine? Take this medicine by mouth with a glass of water. Follow the directions on the prescription label. Take this medicine shortly after a meal or a light snack. Take your medicine at regular intervals. Do not take your medicine more often than directed. Do not stop taking this medicine suddenly except upon the advice of your doctor. Stopping this medicine too quickly may cause serious side effects or your condition may worsen. A special MedGuide will be given to you by the pharmacist with each prescription and refill. Be sure to read this information carefully each time. Talk to your pediatrician regarding the use of this medicine in children. Special care may be needed. Overdosage: If you think you have taken too much of this medicine contact a poison control center or emergency room at once. NOTE: This medicine is only for you. Do not share this medicine with others. What if I miss a dose? If you miss a dose, take it as soon as you can. If it is almost time for your next dose, take only that dose. Do not take double or extra doses. What may interact with this medicine? Do not take this medicine with  any of the following medications: -certain medicines for fungal infections like fluconazole, itraconazole, ketoconazole, posaconazole, voriconazole -cisapride -dofetilide -dronedarone -linezolid -MAOIs like Carbex, Eldepryl, Marplan, Nardil, and Parnate -mesoridazine -methylene blue (injected into a vein) -pimozide -saquinavir -thioridazine -ziprasidone This medicine may also interact with the following medications: -alcohol -antiviral medicines for HIV or AIDS -aspirin and aspirin-like medicines -barbiturates like phenobarbital -certain medicines for blood pressure, heart disease, irregular heart beat -certain medicines for depression, anxiety, or psychotic disturbances -certain medicines for migraine headache like almotriptan, eletriptan, frovatriptan, naratriptan, rizatriptan, sumatriptan, zolmitriptan -certain medicines for seizures like carbamazepine and phenytoin -certain medicines for sleep -certain medicines that treat or prevent blood clots like dalteparin, enoxaparin, warfarin -digoxin -fentanyl -lithium -NSAIDS, medicines for pain and inflammation, like ibuprofen or naproxen -other medicines that prolong the QT interval (cause an abnormal heart rhythm) -rasagiline -supplements like St. John's wort, kava kava, valerian -tramadol -tryptophan This list may not describe all possible interactions. Give your health care provider a list of all the medicines, herbs, non-prescription drugs, or dietary supplements you use. Also tell them if you smoke, drink alcohol, or use illegal drugs. Some items may interact with your medicine. What should I watch for while using this medicine? Tell your doctor if your symptoms do not get better or if they get worse. Visit your doctor or health care professional for regular checks on your progress. Because it may take several weeks to see the full effects of this medicine, it is important to continue your treatment as prescribed by your  doctor. Patients and their families should watch out for  new or worsening thoughts of suicide or depression. Also watch out for sudden changes in feelings such as feeling anxious, agitated, panicky, irritable, hostile, aggressive, impulsive, severely restless, overly excited and hyperactive, or not being able to sleep. If this happens, especially at the beginning of treatment or after a change in dose, call your health care professional. Dennis Bast may get drowsy or dizzy. Do not drive, use machinery, or do anything that needs mental alertness until you know how this medicine affects you. Do not stand or sit up quickly, especially if you are an older patient. This reduces the risk of dizzy or fainting spells. Alcohol may interfere with the effect of this medicine. Avoid alcoholic drinks. This medicine may cause dry eyes and blurred vision. If you wear contact lenses you may feel some discomfort. Lubricating drops may help. See your eye doctor if the problem does not go away or is severe. Your mouth may get dry. Chewing sugarless gum, sucking hard candy and drinking plenty of water may help. Contact your doctor if the problem does not go away or is severe. What side effects may I notice from receiving this medicine? Side effects that you should report to your doctor or health care professional as soon as possible: -allergic reactions like skin rash, itching or hives, swelling of the face, lips, or tongue -elevated mood, decreased need for sleep, racing thoughts, impulsive behavior -confusion -fast, irregular heartbeat -feeling faint or lightheaded, falls -feeling agitated, angry, or irritable -loss of balance or coordination -painful or prolonged erections -restlessness, pacing, inability to keep still -suicidal thoughts or other mood changes -tremors -trouble sleeping -seizures -unusual bleeding or bruising Side effects that usually do not require medical attention (report to your doctor or health care  professional if they continue or are bothersome): -change in sex drive or performance -change in appetite or weight -constipation -headache -muscle aches or pains -nausea This list may not describe all possible side effects. Call your doctor for medical advice about side effects. You may report side effects to FDA at 1-800-FDA-1088. Where should I keep my medicine? Keep out of the reach of children. Store at room temperature between 15 and 30 degrees C (59 to 86 degrees F). Protect from light. Keep container tightly closed. Throw away any unused medicine after the expiration date. NOTE: This sheet is a summary. It may not cover all possible information. If you have questions about this medicine, talk to your doctor, pharmacist, or health care provider.  2018 Elsevier/Gold Standard (2015-11-15 16:57:05)

## 2017-06-19 NOTE — Assessment & Plan Note (Addendum)
Reports increased anxiety: irritability, angry, racing thoughts. Unable to tolerate wellbutrin due to headache, used medication for 4weeks. Unable to tolerate restoril due to increased agitation.  Current use of lexapro only. rx for trazodone sent. Provided anticipatory guidance on possible medication side effects and length of time to reach therapeutic efficacy. F/up in 2-4weeks. Not interested in psychotherapy. Consider psychiatry referral?

## 2017-06-19 NOTE — Assessment & Plan Note (Signed)
Reports increased anxiety: irritability, angry, racing thoughts. Unable to tolerate wellbutrin due to headache, used medication for 4weeks. Unable to tolerate restoril due to increased agitation.  Current use of lexapro only. rx for trazodone sent. Provided anticipatory guidance on possible medication side effects and length of time to reach therapeutic efficacy. F/up in 2-4weeks.

## 2017-06-19 NOTE — Progress Notes (Signed)
Subjective:  Patient ID: [name], female    DOB: Aug 23, 1975  Age: 41 y.o. MRN: [id number]  CC: Follow-up (med follow ups/ stop resteril and welbutrin due to side effects. )   Anxiety  Presents for follow-up visit. Symptoms include chest pain, decreased concentration, depressed mood, excessive worry, hyperventilation, insomnia, irritability, muscle tension, nervous/anxious behavior, panic and restlessness. Patient reports no suicidal ideas. Symptoms occur most days. The severity of symptoms is causing significant distress and interfering with daily activities. The quality of sleep is poor. Nighttime awakenings: several.   Side effects of treatment include headaches.   Reports increased anxiety: irritability, angry, racing thoughts. Unable to tolerate wellbutrin due to headache, used medication for 4weeks. Unable to tolerate restoril due to increased agitation, used medication for 2weeks. She is not interested in counseling at this time. Depression screen [facility] 2/9 06/19/2017 10/28/2016  Decreased Interest 2 2  Down, Depressed, Hopeless 3 2  PHQ - 2 Score 5 4  Altered sleeping 2 2  Tired, decreased energy 3 3  Change in appetite 3 2  Feeling bad or failure about yourself  2 1  Trouble concentrating 2 1  Moving slowly or fidgety/restless 2 0  Suicidal thoughts 0 0  PHQ-9 Score 19 13  Difficult doing work/chores - Somewhat difficult   GAD 7 : Generalized Anxiety Score 06/19/2017  Nervous, Anxious, on Edge 3  Control/stop worrying 3  Worry too much - different things 3  Trouble relaxing 3  Restless 2  Easily annoyed or irritable 3  Afraid - awful might happen 3  Total GAD 7 Score 20    HTN: Stable with lisinopril HCTZ. BP Readings from Last 3 Encounters:  06/19/17 130/82  04/07/17 (!) 144/88  02/24/17 (!) 146/86   Outpatient Medications Prior to Visit  Medication Sig Dispense Refill  . acetaminophen (TYLENOL) 500 MG tablet Take 1,000 mg by mouth every 6 (six) hours  as needed. Pain    . albuterol (PROVENTIL HFA;VENTOLIN HFA) 108 (90 Base) MCG/ACT inhaler Inhale 2 puffs into the lungs every 6 (six) hours as needed for wheezing or shortness of breath. 1 Inhaler 0  . Multiple Vitamin (MULTIVITAMIN WITH MINERALS) TABS Take 1 tablet by mouth daily.    Marland Kitchen omeprazole (PRILOSEC) 20 MG capsule Take 1 capsule (20 mg total) by mouth daily. 90 capsule 0  . escitalopram (LEXAPRO) 20 MG tablet Take 1 tablet (20 mg total) by mouth daily. 30 tablet 0  . lisinopril-hydrochlorothiazide (PRINZIDE,ZESTORETIC) 10-12.5 MG tablet Take 1 tablet by mouth at bedtime. 90 tablet 0  . benzonatate (TESSALON) 100 MG capsule Take 1 capsule (100 mg total) by mouth 3 (three) times daily as needed for cough. (Patient not taking: Reported on 04/07/2017) 20 capsule 0  . buPROPion (WELLBUTRIN XL) 150 MG 24 hr tablet Take 1 tablet (150 mg total) by mouth daily. (Patient not taking: Reported on 06/19/2017) 30 tablet 0  . temazepam (RESTORIL) 15 MG capsule Take 1 capsule (15 mg total) by mouth at bedtime as needed for sleep. (Patient not taking: Reported on 06/19/2017) 30 capsule 0   No facility-administered medications prior to visit.     ROS See HPI  Objective:  BP 130/82   Pulse 99   Temp 98.3 F (36.8 C)   Ht 5' 4.5" (1.638 m)   Wt 293 lb (132.9 kg)   SpO2 95%   BMI 49.52 kg/m   BP Readings from Last 3 Encounters:  06/19/17 130/82  04/07/17 (!) 144/88  02/24/17 Marland Kitchen)  146/86    Wt Readings from Last 3 Encounters:  06/19/17 293 lb (132.9 kg)  04/07/17 292 lb (132.5 kg)  02/24/17 288 lb (130.6 kg)    Physical Exam  Constitutional: She is oriented to person, place, and time. No distress.  Cardiovascular: Normal rate.  Pulmonary/Chest: Effort normal.  Neurological: She is alert and oriented to person, place, and time.  Skin: Skin is warm and dry.  Psychiatric: Her speech is normal. Her affect is labile. Cognition and memory are normal. She exhibits a depressed mood.  Tearful  intermittently during office visit.  Vitals reviewed.   Lab Results  Component Value Date   WBC 10.7 (H) 02/24/2017   HGB 11.6 (L) 02/24/2017   HCT 35.6 (L) 02/24/2017   PLT 456.0 (H) 02/24/2017   GLUCOSE 104 (H) 10/28/2016   CHOL 138 02/22/2013   TRIG 152 (H) 02/22/2013   HDL 34 (L) 02/22/2013   LDLCALC 74 02/22/2013   ALT 14 02/22/2013   AST 11 02/22/2013   NA 138 10/28/2016   K 3.7 10/28/2016   CL 102 10/28/2016   CREATININE 0.51 10/28/2016   BUN 13 10/28/2016   CO2 30 10/28/2016   TSH 1.003 02/22/2013    Dg Chest 2 View  Result Date: 05/18/2013 CLINICAL DATA:  Preop evaluation, gastric sleeve procedure EXAM: CHEST  2 VIEW COMPARISON:  04/20/2012 FINDINGS: The heart size and mediastinal contours are within normal limits. Both lungs are clear. The visualized skeletal structures are unremarkable. IMPRESSION: No active cardiopulmonary disease. Electronically Signed   By: Margaree Mackintosh M.D.   On: 05/18/2013 10:25    Assessment & Plan:   Fabiana was seen today for follow-up.  Diagnoses and all orders for this visit:  Severe episode of recurrent major depressive disorder, without psychotic features ([place]) -     traZODone (DESYREL) 50 MG tablet; Take 1 tablet (50 mg total) by mouth at bedtime. -     Discontinue: escitalopram (LEXAPRO) 20 MG tablet; Take 1 tablet (20 mg total) by mouth daily. -     escitalopram (LEXAPRO) 20 MG tablet; Take 1 tablet (20 mg total) by mouth daily.  GAD (generalized anxiety disorder) -     traZODone (DESYREL) 50 MG tablet; Take 1 tablet (50 mg total) by mouth at bedtime. -     Discontinue: lisinopril-hydrochlorothiazide (PRINZIDE,ZESTORETIC) 10-12.5 MG tablet; Take 1 tablet by mouth at bedtime. -     lisinopril-hydrochlorothiazide (PRINZIDE,ZESTORETIC) 10-12.5 MG tablet; Take 1 tablet by mouth at bedtime.  HTN (hypertension), benign -     Discontinue: lisinopril-hydrochlorothiazide (PRINZIDE,ZESTORETIC) 10-12.5 MG tablet; Take 1 tablet by mouth  at bedtime. -     lisinopril-hydrochlorothiazide (PRINZIDE,ZESTORETIC) 10-12.5 MG tablet; Take 1 tablet by mouth at bedtime.   I have discontinued Palmira Stickle. Wyatt's benzonatate, buPROPion, and temazepam. I am also having her start on traZODone. Additionally, I am having her maintain her acetaminophen, multivitamin with minerals, omeprazole, albuterol, escitalopram, and lisinopril-hydrochlorothiazide.  Meds ordered this encounter  Medications  . traZODone (DESYREL) 50 MG tablet    Sig: Take 1 tablet (50 mg total) by mouth at bedtime.    Dispense:  30 tablet    Refill:  0    Order Specific Question:   Supervising Provider    Answer:   Lucille Passy [[id number]]  . DISCONTD: lisinopril-hydrochlorothiazide (PRINZIDE,ZESTORETIC) 10-12.5 MG tablet    Sig: Take 1 tablet by mouth at bedtime.    Dispense:  90 tablet    Refill:  0    Order  Specific Question:   Supervising Provider    Answer:   Lucille Passy [[id number]]  . DISCONTD: escitalopram (LEXAPRO) 20 MG tablet    Sig: Take 1 tablet (20 mg total) by mouth daily.    Dispense:  30 tablet    Refill:  0    Order Specific Question:   Supervising Provider    Answer:   Lucille Passy [[id number]]  . escitalopram (LEXAPRO) 20 MG tablet    Sig: Take 1 tablet (20 mg total) by mouth daily.    Dispense:  90 tablet    Refill:  1    Order Specific Question:   Supervising Provider    Answer:   Lucille Passy [[id number]]  . lisinopril-hydrochlorothiazide (PRINZIDE,ZESTORETIC) 10-12.5 MG tablet    Sig: Take 1 tablet by mouth at bedtime.    Dispense:  90 tablet    Refill:  1    Order Specific Question:   Supervising Provider    Answer:   Lucille Passy [[id number]]    Follow-up: Return in about 4 weeks (around 07/17/2017) for anxiety and depression.  Wilfred Lacy, NP

## 2017-07-16 MED FILL — ESCITALOPRAM 20 MG TABLET: 20 | 30 days supply | Qty: 30 | Fill #0

## 2017-07-16 MED FILL — LISINOPRIL-HCTZ 10-12.5 MG: 10-12.5 | 90 days supply | Qty: 90 | Fill #0

## 2017-07-17 ENCOUNTER — Ambulatory Visit: Payer: Self-pay | Admitting: Nurse Practitioner

## 2017-07-17 ENCOUNTER — Other Ambulatory Visit: Payer: Self-pay | Admitting: Nurse Practitioner

## 2017-07-17 DIAGNOSIS — K2100 Gastro-esophageal reflux disease with esophagitis, without bleeding: Secondary | ICD-10-CM

## 2017-07-17 DIAGNOSIS — Z0289 Encounter for other administrative examinations: Secondary | ICD-10-CM

## 2017-07-17 MED FILL — OMEPRAZOLE 20 MG CAP: 20 | 90 days supply | Qty: 90 | Fill #0

## 2017-08-25 MED FILL — ESCITALOPRAM 20 MG TABLET: 20 | 90 days supply | Qty: 90 | Fill #0

## 2017-10-20 ENCOUNTER — Other Ambulatory Visit: Payer: Self-pay | Admitting: Nurse Practitioner

## 2017-10-20 DIAGNOSIS — K2100 Gastro-esophageal reflux disease with esophagitis, without bleeding: Secondary | ICD-10-CM

## 2017-10-20 MED FILL — OMEPRAZOLE 20 MG CAP: 20 | 90 days supply | Qty: 90 | Fill #0

## 2017-10-20 MED FILL — LISINOPRIL-HCTZ 10-12.5 MG: 10-12.5 | 90 days supply | Qty: 90 | Fill #0

## 2017-12-04 MED FILL — ESCITALOPRAM 20 MG TABLET: 20 | 90 days supply | Qty: 90 | Fill #1

## 2017-12-08 ENCOUNTER — Telehealth: Payer: Self-pay | Admitting: Family Medicine

## 2017-12-08 DIAGNOSIS — Z111 Encounter for screening for respiratory tuberculosis: Secondary | ICD-10-CM

## 2017-12-08 NOTE — Telephone Encounter (Signed)
Pt stated she never have positive TB in the past but her job required this done by 12/14/2017. Please advise, ok to put in order?

## 2017-12-08 NOTE — Telephone Encounter (Signed)
Left detail message inform the pt, order entered in system and pt can come get it done anytime M-F 8am-4:30pm.

## 2017-12-08 NOTE — Telephone Encounter (Signed)
Yes okay to place order.

## 2017-12-08 NOTE — Telephone Encounter (Signed)
Left a vm for pt to call back, just need to know if she ever a positive TB test in the past or if yes did she had x-ray done. Do not see anything in the chart.      Copied from [place] [id number]. Topic: Appointment Scheduling - Scheduling Inquiry for Clinic >> Dec 08, 2017 12:09 PM Bea Graff, NT wrote: Reason for CRM: Pt is needing quantiferion TB test done for her job. Please call to schedule once order placed.

## 2017-12-09 NOTE — Telephone Encounter (Signed)
Cant leave vm. Will try again later.

## 2017-12-10 NOTE — Telephone Encounter (Signed)
Can not leave vm.

## 2018-01-29 ENCOUNTER — Other Ambulatory Visit: Payer: Self-pay | Admitting: Nurse Practitioner

## 2018-01-29 DIAGNOSIS — K2100 Gastro-esophageal reflux disease with esophagitis, without bleeding: Secondary | ICD-10-CM

## 2018-01-29 MED FILL — LISINOPRIL-HCTZ 10-12.5 TAB: 10-12.5 | 90 days supply | Qty: 90 | Fill #1

## 2018-01-29 MED FILL — OMEPRAZOLE 20 MG CPDR: 20 | 30 days supply | Qty: 30 | Fill #0

## 2018-01-29 NOTE — Telephone Encounter (Signed)
30 day supply sent in for the pt. Do you want to see her back? Last ov for this problem was 10/2016 and this is the note:   Gastroesophageal reflux disease with esophagitis Prolonged use of PPI. Consider switching to pepcid or zantac due to vitamin d and magnesium deficiency.

## 2018-02-01 NOTE — Telephone Encounter (Signed)
Yes she needs OV 

## 2018-03-02 ENCOUNTER — Other Ambulatory Visit: Payer: Self-pay | Admitting: Nurse Practitioner

## 2018-03-02 DIAGNOSIS — K2100 Gastro-esophageal reflux disease with esophagitis, without bleeding: Secondary | ICD-10-CM

## 2018-03-02 MED ORDER — FAMOTIDINE 10 MG PO TABS: 10.0000 mg | ORAL_TABLET | Freq: Two times a day (BID) | ORAL | 3 refills | Status: DC

## 2018-03-02 NOTE — Telephone Encounter (Signed)
Left vm for the pt to call back. Pt needs to see PCP for more refills.   Last office visit note from [place] for this DX:   Gastroesophageal reflux disease with esophagitis Prolonged use of PPI. Consider switching to pepcid or zantac due to vitamin d and magnesium deficiency.

## 2018-03-02 NOTE — Telephone Encounter (Signed)
New Rx (Papcid)sent in per [place].

## 2018-03-03 NOTE — Telephone Encounter (Signed)
Left another vm for the pt to call back. Need to inform the pt of new rx sent per [facility].

## 2018-03-08 ENCOUNTER — Other Ambulatory Visit: Payer: Self-pay | Admitting: Nurse Practitioner

## 2018-03-08 DIAGNOSIS — K2100 Gastro-esophageal reflux disease with esophagitis, without bleeding: Secondary | ICD-10-CM

## 2018-03-10 ENCOUNTER — Ambulatory Visit: Payer: Self-pay | Admitting: Nurse Practitioner

## 2018-03-17 ENCOUNTER — Ambulatory Visit (INDEPENDENT_AMBULATORY_CARE_PROVIDER_SITE_OTHER): Payer: No Typology Code available for payment source | Admitting: Nurse Practitioner

## 2018-03-17 ENCOUNTER — Encounter: Payer: Self-pay | Admitting: Nurse Practitioner

## 2018-03-17 VITALS — BP 124/80 | HR 82 | Temp 98.8°F | Ht 64.5 in | Wt 284.0 lb

## 2018-03-17 DIAGNOSIS — I10 Essential (primary) hypertension: Secondary | ICD-10-CM

## 2018-03-17 DIAGNOSIS — R21 Rash and other nonspecific skin eruption: Secondary | ICD-10-CM

## 2018-03-17 DIAGNOSIS — Z13220 Encounter for screening for lipoid disorders: Secondary | ICD-10-CM

## 2018-03-17 DIAGNOSIS — K2100 Gastro-esophageal reflux disease with esophagitis, without bleeding: Secondary | ICD-10-CM

## 2018-03-17 DIAGNOSIS — F411 Generalized anxiety disorder: Secondary | ICD-10-CM | POA: Diagnosis not present

## 2018-03-17 DIAGNOSIS — F332 Major depressive disorder, recurrent severe without psychotic features: Secondary | ICD-10-CM

## 2018-03-17 DIAGNOSIS — F5104 Psychophysiologic insomnia: Secondary | ICD-10-CM

## 2018-03-17 DIAGNOSIS — Z6841 Body Mass Index (BMI) 40.0 and over, adult: Secondary | ICD-10-CM

## 2018-03-17 DIAGNOSIS — Z136 Encounter for screening for cardiovascular disorders: Secondary | ICD-10-CM | POA: Diagnosis not present

## 2018-03-17 LAB — CBC
HCT: 35.7 % — ABNORMAL LOW (ref 36.0–46.0)
Hemoglobin: 11.9 g/dL — ABNORMAL LOW (ref 12.0–15.0)
MCHC: 33.2 g/dL (ref 30.0–36.0)
MCV: 77.4 fl — ABNORMAL LOW (ref 78.0–100.0)
PLATELETS: 409 10*3/uL — AB (ref 150.0–400.0)
RBC: 4.61 Mil/uL (ref 3.87–5.11)
RDW: 17.8 % — ABNORMAL HIGH (ref 11.5–15.5)
WBC: 9.2 10*3/uL (ref 4.0–10.5)

## 2018-03-17 LAB — BASIC METABOLIC PANEL
BUN: 9 mg/dL (ref 6–23)
CALCIUM: 9.4 mg/dL (ref 8.4–10.5)
CO2: 29 meq/L (ref 19–32)
CREATININE: 0.49 mg/dL (ref 0.40–1.20)
Chloride: 102 mEq/L (ref 96–112)
GFR: 147.53 mL/min (ref >60.00)
GLUCOSE: 99 mg/dL (ref 70–99)
Potassium: 3.9 mEq/L (ref 3.5–5.1)
Sodium: 136 mEq/L (ref 135–145)

## 2018-03-17 LAB — HEPATIC FUNCTION PANEL
ALBUMIN: 4 g/dL (ref 3.5–5.2)
ALT: 14 U/L (ref 0–35)
AST: 12 U/L (ref 0–37)
Alkaline Phosphatase: 106 U/L (ref 39–117)
Bilirubin, Direct: 0.1 mg/dL (ref 0.0–0.3)
TOTAL PROTEIN: 7.6 g/dL (ref 6.0–8.3)
Total Bilirubin: 0.3 mg/dL (ref 0.2–1.2)

## 2018-03-17 LAB — LIPID PANEL
CHOLESTEROL: 150 mg/dL (ref 0–200)
HDL: 36.5 mg/dL — AB (ref >39.00)
LDL Cholesterol: 84 mg/dL (ref 0–99)
NonHDL: 113.52
TRIGLYCERIDES: 150 mg/dL — AB (ref 0.0–149.0)
Total CHOL/HDL Ratio: 4
VLDL: 30 mg/dL (ref 0.0–40.0)

## 2018-03-17 LAB — TSH: TSH: 1.79 u[IU]/mL (ref 0.35–4.50)

## 2018-03-17 MED ORDER — CLOTRIMAZOLE-BETAMETHASONE 1-0.05 % EX CREA: 1.0000 "application " | TOPICAL_CREAM | Freq: Two times a day (BID) | CUTANEOUS | 0 refills | Status: AC

## 2018-03-17 NOTE — Assessment & Plan Note (Signed)
Trazodone caused residual daytime somnolence. Stopped after 2doses. Use of lexapro only. Reports improved mood

## 2018-03-17 NOTE — Patient Instructions (Addendum)
Good Job on weight loss.  Normal thyroid function, BMP and hepatic panel. Stable anemia. Use ferrous sulfate OTC 325mg  once a day with food. Lipid panel indicates persistent eleavtion in triglyceride and low HDL. These numbers can improved by maintaining regular exercise and DASH diet. F/up in 71months.  Let me know if rash does not improve in 1week. Will need referral to dermatology.  May use cerave or eucerin cream for dry skin.  Have GYN fax recent PAP and mammogram report.

## 2018-03-17 NOTE — Progress Notes (Signed)
Subjective:  Patient ID: [name], female    DOB: Jul 14, 1975  Age: 42 y.o. MRN: [id number]  CC: Follow-up (medication refills/lexapro,omeprazole,bp med. ) and Rash (rash on legs,ichy at times, going on 1 mo. referral to dermatology?)   Rash  This is a new problem. The current episode started 1 to 4 weeks ago. The problem has been waxing and waning since onset. The affected locations include the left lower leg and right lower leg. The rash is characterized by itchiness and scaling. It is unknown if there was an exposure to a precipitant. Pertinent negatives include no anorexia, congestion, cough, fatigue, joint pain or nail changes. Past treatments include topical steroids. The treatment provided mild relief. Her past medical history is significant for allergies and eczema.   Depression and Anxiety:  Trazodone caused residual daytime somnolence. Stopped after 2doses. Use of lexapro only. Reports improved mood Depression screen [facility] 2/9 03/17/2018 06/19/2017 10/28/2016  Decreased Interest 0 2 2  Down, Depressed, Hopeless 1 3 2   PHQ - 2 Score 1 5 4   Altered sleeping 0 2 2  Tired, decreased energy 3 3 3   Change in appetite 1 3 2   Feeling bad or failure about yourself  0 2 1  Trouble concentrating 0 2 1  Moving slowly or fidgety/restless 0 2 0  Suicidal thoughts 0 0 0  PHQ-9 Score 5 19 13   Difficult doing work/chores - - Somewhat difficult   GAD 7 : Generalized Anxiety Score 03/17/2018 06/19/2017  Nervous, Anxious, on Edge 1 3  Control/stop worrying 0 3  Worry too much - different things 1 3  Trouble relaxing 0 3  Restless 0 2  Easily annoyed or irritable 0 3  Afraid - awful might happen 0 3  Total GAD 7 Score 2 20   HTN: At goal with lisinopril/HCTZ. BP Readings from Last 3 Encounters:  03/17/18 124/80  06/19/17 130/82  04/07/17 (!) 144/88  made changes to diet: Keto diet. 10lbs weight loss noted today. Wt Readings from Last 3 Encounters:  03/17/18 284 lb (128.8 kg)    06/19/17 293 lb (132.9 kg)  04/07/17 292 lb (132.5 kg)   Plans to schedule CPE with GYN: Dr. Corinna [name] ([facility] next month.  Reviewed past Medical, Social and Family history today.  Outpatient Medications Prior to Visit  Medication Sig Dispense Refill  . albuterol (PROVENTIL HFA;VENTOLIN HFA) 108 (90 Base) MCG/ACT inhaler Inhale 2 puffs into the lungs every 6 (six) hours as needed for wheezing or shortness of breath. 1 Inhaler 0  . Multiple Vitamin (MULTIVITAMIN WITH MINERALS) TABS Take 1 tablet by mouth daily.    Marland Kitchen escitalopram (LEXAPRO) 20 MG tablet Take 1 tablet (20 mg total) by mouth daily. 90 tablet 1  . lisinopril-hydrochlorothiazide (PRINZIDE,ZESTORETIC) 10-12.5 MG tablet Take 1 tablet by mouth at bedtime. 90 tablet 1  . omeprazole (PRILOSEC) 20 MG capsule TAKE 1 CAPSULE BY MOUTH ONCE DAILY 30 capsule 0  . acetaminophen (TYLENOL) 500 MG tablet Take 1,000 mg by mouth every 6 (six) hours as needed. Pain    . famotidine (PEPCID) 10 MG tablet Take 1 tablet (10 mg total) by mouth 2 (two) times daily. (Patient not taking: Reported on 03/17/2018) 180 tablet 3  . traZODone (DESYREL) 50 MG tablet Take 1 tablet (50 mg total) by mouth at bedtime. (Patient not taking: Reported on 03/17/2018) 30 tablet 0   No facility-administered medications prior to visit.     ROS See HPI  Objective:  BP 124/80  Pulse 82   Temp 98.8 F (37.1 C) (Oral)   Ht 5' 4.5" (1.638 m)   Wt 284 lb (128.8 kg)   SpO2 96%   BMI 48.00 kg/m   BP Readings from Last 3 Encounters:  03/17/18 124/80  06/19/17 130/82  04/07/17 (!) 144/88    Wt Readings from Last 3 Encounters:  03/17/18 284 lb (128.8 kg)  06/19/17 293 lb (132.9 kg)  04/07/17 292 lb (132.5 kg)    Physical Exam  Constitutional: She is oriented to person, place, and time. She appears well-developed and well-nourished.  Neck: Normal range of motion. Neck supple. No thyromegaly present.  Cardiovascular: Normal rate, regular rhythm, normal  heart sounds and intact distal pulses.  Pulmonary/Chest: Effort normal and breath sounds normal.  Musculoskeletal: She exhibits no edema.  Neurological: She is alert and oriented to person, place, and time.  Skin: Skin is intact. Rash noted. Rash is maculopapular. No cyanosis. Nails show no clubbing.     Scaly annula maculopapular rash on bilateral LE.  Psychiatric: She has a normal mood and affect. Her behavior is normal. Thought content normal.  Vitals reviewed.   Lab Results  Component Value Date   WBC 9.2 03/17/2018   HGB 11.9 (L) 03/17/2018   HCT 35.7 (L) 03/17/2018   PLT 409.0 (H) 03/17/2018   GLUCOSE 99 03/17/2018   CHOL 150 03/17/2018   TRIG 150.0 (H) 03/17/2018   HDL 36.50 (L) 03/17/2018   LDLCALC 84 03/17/2018   ALT 14 03/17/2018   AST 12 03/17/2018   NA 136 03/17/2018   K 3.9 03/17/2018   CL 102 03/17/2018   CREATININE 0.49 03/17/2018   BUN 9 03/17/2018   CO2 29 03/17/2018   TSH 1.79 03/17/2018    Dg Chest 2 View  Result Date: 05/18/2013 CLINICAL DATA:  Preop evaluation, gastric sleeve procedure EXAM: CHEST  2 VIEW COMPARISON:  04/20/2012 FINDINGS: The heart size and mediastinal contours are within normal limits. Both lungs are clear. The visualized skeletal structures are unremarkable. IMPRESSION: No active cardiopulmonary disease. Electronically Signed   By: Margaree Mackintosh M.D.   On: 05/18/2013 10:25    Assessment & Plan:   [name] was seen today for follow-up and rash.  Diagnoses and all orders for this visit:  HTN (hypertension), benign -     Basic metabolic panel -     CBC -     lisinopril-hydrochlorothiazide (PRINZIDE,ZESTORETIC) 10-12.5 MG tablet; Take 1 tablet by mouth at bedtime.  Severe episode of recurrent major depressive disorder, without psychotic features ([place]) -     escitalopram (LEXAPRO) 20 MG tablet; Take 1 tablet (20 mg total) by mouth daily.  GAD (generalized anxiety disorder) -     lisinopril-hydrochlorothiazide  (PRINZIDE,ZESTORETIC) 10-12.5 MG tablet; Take 1 tablet by mouth at bedtime.  Psychophysiological insomnia -     TSH  Encounter for lipid screening for cardiovascular disease -     Lipid panel  Class 3 severe obesity with body mass index (BMI) of 45.0 to 49.9 in adult, unspecified obesity type, unspecified whether serious comorbidity present (HCC) -     TSH -     Lipid panel -     Hepatic function panel  Rash, skin -     clotrimazole-betamethasone (LOTRISONE) cream; Apply 1 application topically 2 (two) times daily.  Gastroesophageal reflux disease with esophagitis -     omeprazole (PRILOSEC) 20 MG capsule; Take 1 capsule (20 mg total) by mouth daily.   I have discontinued Dinisha Cai. Walmer's  traZODone and famotidine. I have also changed her omeprazole. Additionally, I am having her start on clotrimazole-betamethasone. Lastly, I am having her maintain her acetaminophen, multivitamin with minerals, albuterol, lisinopril-hydrochlorothiazide, and escitalopram.  Meds ordered this encounter  Medications  . clotrimazole-betamethasone (LOTRISONE) cream    Sig: Apply 1 application topically 2 (two) times daily.    Dispense:  30 g    Refill:  0    Order Specific Question:   Supervising Provider    Answer:   MATTHEWS, CODY [[id number]]  . lisinopril-hydrochlorothiazide (PRINZIDE,ZESTORETIC) 10-12.5 MG tablet    Sig: Take 1 tablet by mouth at bedtime.    Dispense:  90 tablet    Refill:  3    Order Specific Question:   Supervising Provider    Answer:   MATTHEWS, CODY [[id number]]  . escitalopram (LEXAPRO) 20 MG tablet    Sig: Take 1 tablet (20 mg total) by mouth daily.    Dispense:  90 tablet    Refill:  3    Order Specific Question:   Supervising Provider    Answer:   MATTHEWS, CODY [[id number]]  . omeprazole (PRILOSEC) 20 MG capsule    Sig: Take 1 capsule (20 mg total) by mouth daily.    Dispense:  90 capsule    Refill:  1    Order Specific Question:   Supervising Provider    Answer:   MATTHEWS,  CODY [[id number]]    Follow-up: Return in about 6 months (around 09/15/2018) for HTN and depression.  Wilfred Lacy, NP

## 2018-03-18 MED ORDER — LISINOPRIL-HYDROCHLOROTHIAZIDE 10-12.5 MG PO TABS: 1.0000 | ORAL_TABLET | Freq: Every day | ORAL | 3 refills | Status: DC

## 2018-03-18 MED ORDER — ESCITALOPRAM OXALATE 20 MG PO TABS: 20.0000 mg | ORAL_TABLET | Freq: Every day | ORAL | 3 refills | Status: DC

## 2018-03-18 MED ORDER — OMEPRAZOLE 20 MG PO CPDR: 20.0000 mg | DELAYED_RELEASE_CAPSULE | Freq: Every day | ORAL | 1 refills | Status: DC

## 2018-03-18 MED FILL — ESCITALOPRAM 20 MG TABLET: 20 | 90 days supply | Qty: 90 | Fill #0

## 2018-03-18 MED FILL — OMEPRAZOLE 20 MG CPDR: 20 | 90 days supply | Qty: 90 | Fill #0

## 2018-04-07 ENCOUNTER — Encounter: Payer: Self-pay | Admitting: Nurse Practitioner

## 2018-04-07 ENCOUNTER — Ambulatory Visit (INDEPENDENT_AMBULATORY_CARE_PROVIDER_SITE_OTHER): Payer: No Typology Code available for payment source | Admitting: Nurse Practitioner

## 2018-04-07 VITALS — BP 132/90 | HR 88 | Temp 98.2°F | Ht 64.5 in | Wt 287.0 lb

## 2018-04-07 DIAGNOSIS — R519 Headache, unspecified: Secondary | ICD-10-CM

## 2018-04-07 DIAGNOSIS — I10 Essential (primary) hypertension: Secondary | ICD-10-CM | POA: Diagnosis not present

## 2018-04-07 DIAGNOSIS — F411 Generalized anxiety disorder: Secondary | ICD-10-CM | POA: Diagnosis not present

## 2018-04-07 DIAGNOSIS — R51 Headache: Secondary | ICD-10-CM

## 2018-04-07 MED ORDER — ALPRAZOLAM 0.25 MG PO TABS: 0.2500 mg | ORAL_TABLET | Freq: Every day | ORAL | 0 refills | Status: DC | PRN

## 2018-04-07 MED ORDER — ASPIRIN-ACETAMINOPHEN-CAFFEINE 250-250-65 MG PO TABS: 1.0000 | ORAL_TABLET | Freq: Four times a day (QID) | ORAL | 0 refills | Status: DC | PRN

## 2018-04-07 MED FILL — ALPRAZolam 0.25 MG TABS: 0.25 | 5 days supply | Qty: 10 | Fill #0

## 2018-04-07 NOTE — Progress Notes (Signed)
Subjective:  Patient ID: [name], female    DOB: 12-15-1975  Age: 42 y.o. MRN: [id number]  CC: Follow-up (anxiety is getting worse,headache,sinus pressure,high BP reading at work/meds/BP med consult/going on 1 wk. )  Headache   This is a new problem. The current episode started in the past 7 days. The problem occurs intermittently. The problem has been resolved. The pain is located in the retro-orbital and frontal region. The pain does not radiate. The pain quality is not similar to prior headaches. The quality of the pain is described as aching and dull. Associated symptoms include blurred vision, dizziness and sinus pressure. Pertinent negatives include no abnormal behavior, anorexia, coughing, drainage, ear pain, eye redness, eye watering, fever, insomnia, loss of balance, muscle aches, nausea, neck pain, photophobia, rhinorrhea, scalp tenderness, seizures, sore throat, tinnitus, visual change, vomiting or weakness. The symptoms are aggravated by emotional stress. She has tried nothing for the symptoms. Her past medical history is significant for hypertension and migraine headaches. There is no history of obesity, recent head traumas or sinus disease.    Headache:  3 episodes of elevated BP at work: 150s/80s at about 3pm, associated with headache, dizziness and blurry vision. BP not checked in even nor morning. Also reports feeling mor anxious, no change in social hx or diet or medication. Normally takes BP medication in evening  Reviewed past Medical, Social and Family history today.  Outpatient Medications Prior to Visit  Medication Sig Dispense Refill  . acetaminophen (TYLENOL) 500 MG tablet Take 1,000 mg by mouth every 6 (six) hours as needed. Pain    . albuterol (PROVENTIL HFA;VENTOLIN HFA) 108 (90 Base) MCG/ACT inhaler Inhale 2 puffs into the lungs every 6 (six) hours as needed for wheezing or shortness of breath. 1 Inhaler 0  . clotrimazole-betamethasone (LOTRISONE) cream  Apply 1 application topically 2 (two) times daily. 30 g 0  . escitalopram (LEXAPRO) 20 MG tablet Take 1 tablet (20 mg total) by mouth daily. 90 tablet 3  . lisinopril-hydrochlorothiazide (PRINZIDE,ZESTORETIC) 10-12.5 MG tablet Take 1 tablet by mouth at bedtime. 90 tablet 3  . Multiple Vitamin (MULTIVITAMIN WITH MINERALS) TABS Take 1 tablet by mouth daily.    Marland Kitchen omeprazole (PRILOSEC) 20 MG capsule Take 1 capsule (20 mg total) by mouth daily. 90 capsule 1   No facility-administered medications prior to visit.     ROS See HPI  Objective:  BP 132/90   Pulse 88   Temp 98.2 F (36.8 C) (Oral)   Ht 5' 4.5" (1.638 m)   Wt 287 lb (130.2 kg)   SpO2 94%   BMI 48.50 kg/m   BP Readings from Last 3 Encounters:  04/07/18 132/90  03/17/18 124/80  06/19/17 130/82    Wt Readings from Last 3 Encounters:  04/07/18 287 lb (130.2 kg)  03/17/18 284 lb (128.8 kg)  06/19/17 293 lb (132.9 kg)    Physical Exam  Constitutional: She is oriented to person, place, and time.  HENT:  Right Ear: Tympanic membrane, external ear and ear canal normal.  Left Ear: Tympanic membrane, external ear and ear canal normal.  Nose: No mucosal edema. Right sinus exhibits maxillary sinus tenderness and frontal sinus tenderness. Left sinus exhibits maxillary sinus tenderness and frontal sinus tenderness.  Mouth/Throat: Uvula is midline. No posterior oropharyngeal erythema.  Eyes: Pupils are equal, round, and reactive to light. Conjunctivae and EOM are normal.  Neck: Normal range of motion. Neck supple.  Cardiovascular: Normal rate, regular rhythm and normal heart sounds.  Pulmonary/Chest: Effort normal.  Musculoskeletal: She exhibits no edema.  Neurological: She is alert and oriented to person, place, and time. No cranial nerve deficit.  Psychiatric: She has a normal mood and affect. Her behavior is normal. Thought content normal.  Vitals reviewed.   Lab Results  Component Value Date   WBC 9.2 03/17/2018   HGB  11.9 (L) 03/17/2018   HCT 35.7 (L) 03/17/2018   PLT 409.0 (H) 03/17/2018   GLUCOSE 99 03/17/2018   CHOL 150 03/17/2018   TRIG 150.0 (H) 03/17/2018   HDL 36.50 (L) 03/17/2018   LDLCALC 84 03/17/2018   ALT 14 03/17/2018   AST 12 03/17/2018   NA 136 03/17/2018   K 3.9 03/17/2018   CL 102 03/17/2018   CREATININE 0.49 03/17/2018   BUN 9 03/17/2018   CO2 29 03/17/2018   TSH 1.79 03/17/2018    Dg Chest 2 View  Result Date: 05/18/2013 CLINICAL DATA:  Preop evaluation, gastric sleeve procedure EXAM: CHEST  2 VIEW COMPARISON:  04/20/2012 FINDINGS: The heart size and mediastinal contours are within normal limits. Both lungs are clear. The visualized skeletal structures are unremarkable. IMPRESSION: No active cardiopulmonary disease. Electronically Signed   By: Margaree Mackintosh M.D.   On: 05/18/2013 10:25    Assessment & Plan:   Carmelia was seen today for follow-up.  Diagnoses and all orders for this visit:  HTN (hypertension), benign  GAD (generalized anxiety disorder) -     ALPRAZolam (XANAX) 0.25 MG tablet; Take 1-2 tablets (0.25-0.5 mg total) by mouth daily as needed for anxiety.  Acute intractable headache, unspecified headache type -     aspirin-acetaminophen-caffeine (EXCEDRIN MIGRAINE) 250-250-65 MG tablet; Take 1 tablet by mouth every 6 (six) hours as needed for headache.   I am having Jaeline Whobrey. Vanengen start on ALPRAZolam and aspirin-acetaminophen-caffeine. I am also having her maintain her acetaminophen, multivitamin with minerals, albuterol, clotrimazole-betamethasone, lisinopril-hydrochlorothiazide, escitalopram, and omeprazole.  Meds ordered this encounter  Medications  . ALPRAZolam (XANAX) 0.25 MG tablet    Sig: Take 1-2 tablets (0.25-0.5 mg total) by mouth daily as needed for anxiety.    Dispense:  10 tablet    Refill:  0    Order Specific Question:   Supervising Provider    Answer:   Lucille Passy [[id number]]  . aspirin-acetaminophen-caffeine (EXCEDRIN MIGRAINE)  250-250-65 MG tablet    Sig: Take 1 tablet by mouth every 6 (six) hours as needed for headache.    Dispense:  30 tablet    Refill:  0    Order Specific Question:   Supervising Provider    Answer:   Lucille Passy [[id number]]    Follow-up: Return if symptoms worsen or fail to improve.  Wilfred Lacy, NP

## 2018-04-07 NOTE — Patient Instructions (Addendum)
Please check BP morning and at bedtime Send BP readings via mychart on Friday.  Maintain adequate oral hydration.   General Headache Without Cause A headache is pain or discomfort felt around the head or neck area. There are many causes and types of headaches. In some cases, the cause may not be found. Follow these instructions at home: Managing pain  Take over-the-counter and prescription medicines only as told by your doctor.  Lie down in a dark, quiet room when you have a headache.  If directed, apply ice to the head and neck area: ? Put ice in a plastic bag. ? Place a towel between your skin and the bag. ? Leave the ice on for 20 minutes, 2-3 times per day.  Use a heating pad or hot shower to apply heat to the head and neck area as told by your doctor.  Keep lights dim if bright lights bother you or make your headaches worse. Eating and drinking  Eat meals on a regular schedule.  Lessen how much alcohol you drink.  Lessen how much caffeine you drink, or stop drinking caffeine. General instructions  Keep all follow-up visits as told by your doctor. This is important.  Keep a journal to find out if certain things bring on headaches. For example, write down: ? What you eat and drink. ? How much sleep you get. ? Any change to your diet or medicines.  Relax by getting a massage or doing other relaxing activities.  Lessen stress.  Sit up straight. Do not tighten (tense) your muscles.  Do not use tobacco products. This includes cigarettes, chewing tobacco, or e-cigarettes. If you need help quitting, ask your doctor.  Exercise regularly as told by your doctor.  Get enough sleep. This often means 7-9 hours of sleep. Contact a doctor if:  Your symptoms are not helped by medicine.  You have a headache that feels different than the other headaches.  You feel sick to your stomach (nauseous) or you throw up (vomit).  You have a fever. Get help right away if:  Your  headache becomes really bad.  You keep throwing up.  You have a stiff neck.  You have trouble seeing.  You have trouble speaking.  You have pain in the eye or ear.  Your muscles are weak or you lose muscle control.  You lose your balance or have trouble walking.  You feel like you will pass out (faint) or you pass out.  You have confusion. This information is not intended to replace advice given to you by your health care provider. Make sure you discuss any questions you have with your health care provider. Document Released: 03/25/2008 Document Revised: 11/22/2015 Document Reviewed: 10/09/2014 Elsevier Interactive Patient Education  [employer].

## 2018-04-09 ENCOUNTER — Encounter: Payer: Self-pay | Admitting: Nurse Practitioner

## 2018-04-22 ENCOUNTER — Telehealth: Payer: Self-pay | Admitting: Nurse Practitioner

## 2018-04-22 DIAGNOSIS — F332 Major depressive disorder, recurrent severe without psychotic features: Secondary | ICD-10-CM

## 2018-04-22 MED ORDER — ESCITALOPRAM OXALATE 20 MG PO TABS: 20.0000 mg | ORAL_TABLET | Freq: Every day | ORAL | 0 refills | Status: DC

## 2018-04-22 MED FILL — ESCITALOPRAM 20 MG TABLET: 20 | 30 days supply | Qty: 30 | Fill #0

## 2018-04-22 NOTE — Telephone Encounter (Signed)
Ok to send in 30-day supply.

## 2018-04-22 NOTE — Telephone Encounter (Signed)
Copied from [place] [id number]. Topic: General - Other >> Apr 22, 2018 10:10 AM Lennox Solders wrote: Reason for CRM: pt is calling and she lost lexapro medication  about 2 wk ago. [facility]

## 2018-04-22 NOTE — Telephone Encounter (Signed)
30 days supply sent to pharmacy. Pt is aware.

## 2018-04-22 NOTE — Telephone Encounter (Signed)
Pt stated she lost this prescription after she pick it up from pharmacy. She is out of this med, please advise, ok to send in.

## 2018-05-18 MED FILL — LISINOPRIL-HCTZ 10-12.5 TAB: 10-12.5 | 90 days supply | Qty: 90 | Fill #0

## 2018-06-24 ENCOUNTER — Ambulatory Visit (INDEPENDENT_AMBULATORY_CARE_PROVIDER_SITE_OTHER): Payer: No Typology Code available for payment source | Admitting: Nurse Practitioner

## 2018-06-24 ENCOUNTER — Other Ambulatory Visit: Payer: No Typology Code available for payment source

## 2018-06-24 ENCOUNTER — Encounter: Payer: Self-pay | Admitting: Nurse Practitioner

## 2018-06-24 VITALS — BP 130/82 | HR 85 | Temp 98.8°F | Ht 64.5 in | Wt 293.0 lb

## 2018-06-24 DIAGNOSIS — R1084 Generalized abdominal pain: Secondary | ICD-10-CM

## 2018-06-24 DIAGNOSIS — R197 Diarrhea, unspecified: Secondary | ICD-10-CM

## 2018-06-24 MED ORDER — SACCHAROMYCES BOULARDII 250 MG PO CAPS: 250.0000 mg | ORAL_CAPSULE | Freq: Two times a day (BID) | ORAL | Status: DC

## 2018-06-24 MED ORDER — BISMUTH SUBSALICYLATE 262 MG/15ML PO SUSP: 30.0000 mL | Freq: Three times a day (TID) | ORAL | 0 refills | Status: DC

## 2018-06-24 NOTE — Addendum Note (Signed)
Addended by: Lynnea Ferrier on: 06/24/2018 02:12 PM   Modules accepted: Orders

## 2018-06-24 NOTE — Progress Notes (Signed)
Subjective:  Patient ID: [name], female    DOB: 09-Aug-1975  Age: 42 y.o. MRN: [id number]  CC: Diarrhea (started saturday, watery, frothy, little bits of water, bread and gatorade on sunday slowed down but didnt stop monday tried chicken started back no vomiting)   Diarrhea   This is a new problem. The current episode started 1 to 4 weeks ago. The problem occurs 5 to 10 times per day. The problem has been waxing and waning. The stool consistency is described as watery and mucous. The patient states that diarrhea awakens her from sleep. Associated symptoms include abdominal pain, bloating and increased flatus. Pertinent negatives include no arthralgias, chills, coughing, fever, headaches, myalgias, sweats, URI, vomiting or weight loss. Nothing aggravates the symptoms. 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 She has tried change of diet and increased fluids for the symptoms. The treatment provided mild relief. There is no history of inflammatory bowel disease or irritable bowel syndrome.  describes stool as watery and frothy. No melena, no hematochezia. No recent travel  Reviewed past Medical, Social and Family history today.  Outpatient Medications Prior to Visit  Medication Sig Dispense Refill  . acetaminophen (TYLENOL) 500 MG tablet Take 1,000 mg by mouth every 6 (six) hours as needed. Pain    . albuterol (PROVENTIL HFA;VENTOLIN HFA) 108 (90 Base) MCG/ACT inhaler Inhale 2 puffs into the lungs every 6 (six) hours as needed for wheezing or shortness of breath. 1 Inhaler 0  . ALPRAZolam (XANAX) 0.25 MG tablet Take 1-2 tablets (0.25-0.5 mg total) by mouth daily as needed for anxiety. 10 tablet 0  . aspirin-acetaminophen-caffeine (EXCEDRIN MIGRAINE) 250-250-65 MG tablet Take 1 tablet by mouth every 6 (six) hours as needed for headache. 30 tablet 0  . clotrimazole-betamethasone (LOTRISONE) cream Apply 1 application topically 2 (two) times daily. 30 g 0  . escitalopram (LEXAPRO) 20 MG tablet  Take 1 tablet (20 mg total) by mouth daily. 30 tablet 0  . lisinopril-hydrochlorothiazide (PRINZIDE,ZESTORETIC) 10-12.5 MG tablet Take 1 tablet by mouth at bedtime. 90 tablet 3  . Multiple Vitamin (MULTIVITAMIN WITH MINERALS) TABS Take 1 tablet by mouth daily.    Marland Kitchen omeprazole (PRILOSEC) 20 MG capsule Take 1 capsule (20 mg total) by mouth daily. 90 capsule 1   No facility-administered medications prior to visit.     ROS See HPI  Objective:  BP 130/82 (BP Location: Right Arm, Patient Position: Sitting)   Pulse 85   Temp 98.8 F (37.1 C) (Oral)   Ht 5' 4.5" (1.638 m)   Wt 293 lb (132.9 kg)   SpO2 95%   BMI 49.52 kg/m   BP Readings from Last 3 Encounters:  06/24/18 130/82  04/07/18 132/90  03/17/18 124/80    Wt Readings from Last 3 Encounters:  06/24/18 293 lb (132.9 kg)  04/07/18 287 lb (130.2 kg)  03/17/18 284 lb (128.8 kg)    Physical Exam Vitals signs reviewed.  Constitutional:      Appearance: She is obese.  Cardiovascular:     Rate and Rhythm: Normal rate.  Pulmonary:     Effort: Pulmonary effort is normal.  Abdominal:     General: Bowel sounds are normal. There is no distension.     Palpations: Abdomen is soft. There is no mass.     Tenderness: There is abdominal tenderness. There is no guarding or rebound.     Hernia: A hernia is present. Hernia is present in the ventral area.     Comments: Generalized tenderness  Neurological:  Mental Status: She is alert.     Lab Results  Component Value Date   WBC 9.2 03/17/2018   HGB 11.9 (L) 03/17/2018   HCT 35.7 (L) 03/17/2018   PLT 409.0 (H) 03/17/2018   GLUCOSE 99 03/17/2018   CHOL 150 03/17/2018   TRIG 150.0 (H) 03/17/2018   HDL 36.50 (L) 03/17/2018   LDLCALC 84 03/17/2018   ALT 14 03/17/2018   AST 12 03/17/2018   NA 136 03/17/2018   K 3.9 03/17/2018   CL 102 03/17/2018   CREATININE 0.49 03/17/2018   BUN 9 03/17/2018   CO2 29 03/17/2018   TSH 1.79 03/17/2018    Dg Chest 2 View  Result Date:  05/18/2013 CLINICAL DATA:  Preop evaluation, gastric sleeve procedure EXAM: CHEST  2 VIEW COMPARISON:  04/20/2012 FINDINGS: The heart size and mediastinal contours are within normal limits. Both lungs are clear. The visualized skeletal structures are unremarkable. IMPRESSION: No active cardiopulmonary disease. Electronically Signed   By: Margaree Mackintosh M.D.   On: 05/18/2013 10:25    Assessment & Plan:   [name] was seen today for diarrhea.  Diagnoses and all orders for this visit:  Diarrhea, unspecified type -     C. difficile GDH and Toxin A/B; Future -     Stool Culture; Future -     bismuth subsalicylate (PEPTO-BISMOL) 262 MG/15ML suspension; Take 30 mLs by mouth 4 (four) times daily -  before meals and at bedtime. -     saccharomyces boulardii (FLORASTOR) 250 MG capsule; Take 1 capsule (250 mg total) by mouth 2 (two) times daily.  Generalized abdominal pain -     bismuth subsalicylate (PEPTO-BISMOL) 262 MG/15ML suspension; Take 30 mLs by mouth 4 (four) times daily -  before meals and at bedtime. -     saccharomyces boulardii (FLORASTOR) 250 MG capsule; Take 1 capsule (250 mg total) by mouth 2 (two) times daily.   I am having [name]. [name] start on bismuth subsalicylate and saccharomyces boulardii. I am also having her maintain her acetaminophen, multivitamin with minerals, albuterol, clotrimazole-betamethasone, lisinopril-hydrochlorothiazide, omeprazole, ALPRAZolam, aspirin-acetaminophen-caffeine, and escitalopram.  Meds ordered this encounter  Medications  . bismuth subsalicylate (PEPTO-BISMOL) 262 MG/15ML suspension    Sig: Take 30 mLs by mouth 4 (four) times daily -  before meals and at bedtime.    Dispense:  360 mL    Refill:  0    Order Specific Question:   Supervising Provider    Answer:   Lucille Passy [[id number]]  . saccharomyces boulardii (FLORASTOR) 250 MG capsule    Sig: Take 1 capsule (250 mg total) by mouth 2 (two) times daily.    Order Specific Question:    Supervising Provider    Answer:   Lucille Passy [[id number]]    Problem List Items Addressed This Visit    None    Visit Diagnoses    Diarrhea, unspecified type    -  Primary   Relevant Medications   bismuth subsalicylate (PEPTO-BISMOL) 262 MG/15ML suspension   saccharomyces boulardii (FLORASTOR) 250 MG capsule   Other Relevant Orders   C. difficile GDH and Toxin A/B   Stool Culture   Generalized abdominal pain       Relevant Medications   bismuth subsalicylate (PEPTO-BISMOL) 262 MG/15ML suspension   saccharomyces boulardii (FLORASTOR) 250 MG capsule      Follow-up: Return if symptoms worsen or fail to improve.  Wilfred Lacy, NP

## 2018-06-24 NOTE — Patient Instructions (Signed)
Maintain clear liquid diet for 2days, then advance diet to BRAT for 2-3days, then advance as tolerated.  Return stool to lab as soon as possible. Go to lab for collections kit.   Diarrhea, Adult Diarrhea is frequent loose and watery bowel movements. Diarrhea can make you feel weak and cause you to become dehydrated. Dehydration can make you tired and thirsty, cause you to have a dry mouth, and decrease how often you urinate. Diarrhea typically lasts 2-3 days. However, it can last longer if it is a sign of something more serious. It is important to treat your diarrhea as told by your health care provider. Follow these instructions at home: Eating and drinking     Follow these recommendations as told by your health care provider:  Take an oral rehydration solution (ORS). This is an over-the-counter medicine that helps return your body to its normal balance of nutrients and water. It is found at pharmacies and retail stores.  Drink plenty of fluids, such as water, ice chips, diluted fruit juice, and low-calorie sports drinks. You can drink milk also, if desired.  Avoid drinking fluids that contain a lot of sugar or caffeine, such as energy drinks, sports drinks, and soda.  Eat bland, easy-to-digest foods in small amounts as you are able. These foods include bananas, applesauce, rice, lean meats, toast, and crackers.  Avoid alcohol.  Avoid spicy or fatty foods.  Medicines  Take over-the-counter and prescription medicines only as told by your health care provider.  If you were prescribed an antibiotic medicine, take it as told by your health care provider. Do not stop using the antibiotic even if you start to feel better. General instructions   Wash your hands often using soap and water. If soap and water are not available, use a hand sanitizer. Others in the household should wash their hands as well. Hands should be washed: ? After using the toilet or changing a diaper. ? Before  preparing, cooking, or serving food. ? While caring for a sick person or while visiting someone in a hospital.  Drink enough fluid to keep your urine pale yellow.  Rest at home while you recover.  Watch your condition for any changes.  Take a warm bath to relieve any burning or pain from frequent diarrhea episodes.  Keep all follow-up visits as told by your health care provider. This is important. Contact a health care provider if:  You have a fever.  Your diarrhea gets worse.  You have new symptoms.  You cannot keep fluids down.  You feel light-headed or dizzy.  You have a headache.  You have muscle cramps. Get help right away if:  You have chest pain.  You feel extremely weak or you faint.  You have bloody or black stools or stools that look like tar.  You have severe pain, cramping, or bloating in your abdomen.  You have trouble breathing or you are breathing very quickly.  Your heart is beating very quickly.  Your skin feels cold and clammy.  You feel confused.  You have signs of dehydration, such as: ? Dark urine, very little urine, or no urine. ? Cracked lips. ? Dry mouth. ? Sunken eyes. ? Sleepiness. ? Weakness. Summary  Diarrhea is frequent loose and watery bowel movements. Diarrhea can make you feel weak and cause you to become dehydrated.  Drink enough fluids to keep your urine pale yellow.  Make sure that you wash your hands after using the toilet. If soap and water  are not available, use hand sanitizer.  Contact a health care provider if your diarrhea gets worse or you have new symptoms.  Get help right away if you have signs of dehydration. This information is not intended to replace advice given to you by your health care provider. Make sure you discuss any questions you have with your health care provider. Document Released: 06/06/2002 Document Revised: 11/20/2017 Document Reviewed: 11/20/2017 Elsevier Interactive Patient Education   2019 [employer].

## 2018-06-28 LAB — STOOL CULTURE
MICRO NUMBER: 91540979
MICRO NUMBER:: 91540977
MICRO NUMBER:: 91540978
SHIGA RESULT: NOT DETECTED
SPECIMEN QUALITY: ADEQUATE
SPECIMEN QUALITY:: ADEQUATE
SPECIMEN QUALITY:: ADEQUATE

## 2018-06-28 LAB — C. DIFFICILE GDH AND TOXIN A/B
GDH ANTIGEN: NOT DETECTED
MICRO NUMBER: 91541068
SPECIMEN QUALITY: ADEQUATE
TOXIN A AND B: NOT DETECTED

## 2018-07-28 ENCOUNTER — Ambulatory Visit (INDEPENDENT_AMBULATORY_CARE_PROVIDER_SITE_OTHER): Payer: No Typology Code available for payment source | Admitting: Nurse Practitioner

## 2018-07-28 ENCOUNTER — Encounter: Payer: Self-pay | Admitting: Nurse Practitioner

## 2018-07-28 VITALS — BP 140/90 | HR 80 | Temp 98.0°F | Ht 64.5 in | Wt 299.8 lb

## 2018-07-28 DIAGNOSIS — S39012A Strain of muscle, fascia and tendon of lower back, initial encounter: Secondary | ICD-10-CM | POA: Diagnosis not present

## 2018-07-28 MED ORDER — PREDNISONE 10 MG (21) PO TBPK: ORAL_TABLET | ORAL | 0 refills | Status: DC

## 2018-07-28 MED ORDER — KETOROLAC TROMETHAMINE 30 MG/ML IJ SOLN
30.0000 mg | Freq: Once | INTRAMUSCULAR | Status: AC
  Administered 2018-07-28: 30 mg via INTRAMUSCULAR

## 2018-07-28 MED ORDER — CYCLOBENZAPRINE HCL 5 MG PO TABS: 5.0000 mg | ORAL_TABLET | Freq: Every day | ORAL | 0 refills | Status: DC

## 2018-07-28 NOTE — Progress Notes (Signed)
Subjective:  Patient ID: [name], female    DOB: [birth year]  Age: 43 y.o. MRN: [id number]  CC: Back Pain (pt is complaining of back pain,tight,cant stand up/ 4 days ago/tried ibuprofen and ice. )   Back Pain  This is a new problem. The current episode started in the past 7 days. The problem occurs constantly. The problem is unchanged. The pain is present in the lumbar spine. The quality of the pain is described as cramping and aching. The pain does not radiate. The pain is the same all the time. The symptoms are aggravated by position, twisting and standing. Pertinent negatives include no abdominal pain, bladder incontinence, bowel incontinence, leg pain, numbness, paresis, paresthesias, pelvic pain, perianal numbness, tingling or weakness. 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 She has tried NSAIDs and ice for the symptoms. The treatment provided no relief.   Reviewed past Medical, Social and Family history today.  Outpatient Medications Prior to Visit  Medication Sig Dispense Refill  . acetaminophen (TYLENOL) 500 MG tablet Take 1,000 mg by mouth every 6 (six) hours as needed. Pain    . albuterol (PROVENTIL HFA;VENTOLIN HFA) 108 (90 Base) MCG/ACT inhaler Inhale 2 puffs into the lungs every 6 (six) hours as needed for wheezing or shortness of breath. 1 Inhaler 0  . ALPRAZolam (XANAX) 0.25 MG tablet Take 1-2 tablets (0.25-0.5 mg total) by mouth daily as needed for anxiety. 10 tablet 0  . aspirin-acetaminophen-caffeine (EXCEDRIN MIGRAINE) 250-250-65 MG tablet Take 1 tablet by mouth every 6 (six) hours as needed for headache. 30 tablet 0  . bismuth subsalicylate (PEPTO-BISMOL) 262 MG/15ML suspension Take 30 mLs by mouth 4 (four) times daily -  before meals and at bedtime. 360 mL 0  . clotrimazole-betamethasone (LOTRISONE) cream Apply 1 application topically 2 (two) times daily. 30 g 0  . escitalopram (LEXAPRO) 20 MG tablet Take 1 tablet (20 mg total) by  mouth daily. 30 tablet 0  . lisinopril-hydrochlorothiazide (PRINZIDE,ZESTORETIC) 10-12.5 MG tablet Take 1 tablet by mouth at bedtime. 90 tablet 3  . Multiple Vitamin (MULTIVITAMIN WITH MINERALS) TABS Take 1 tablet by mouth daily.    Marland Kitchen omeprazole (PRILOSEC) 20 MG capsule Take 1 capsule (20 mg total) by mouth daily. 90 capsule 1  . saccharomyces boulardii (FLORASTOR) 250 MG capsule Take 1 capsule (250 mg total) by mouth 2 (two) times daily.     No facility-administered medications prior to visit.     ROS See HPI  Objective:  BP 140/90   Pulse 80   Temp 98 F (36.7 C) (Oral)   Ht 5' 4.5" (1.638 m)   Wt 299 lb 12.8 oz (136 kg)   SpO2 97%   BMI 50.67 kg/m   BP Readings from Last 3 Encounters:  07/28/18 140/90  06/24/18 130/82  04/07/18 132/90    Wt Readings from Last 3 Encounters:  07/28/18 299 lb 12.8 oz (136 kg)  06/24/18 293 lb (132.9 kg)  04/07/18 287 lb (130.2 kg)    Physical Exam Vitals signs reviewed.  Cardiovascular:     Rate and Rhythm: Normal rate.     Pulses: Normal pulses.  Musculoskeletal:     Right hip: Normal.     Left hip: Normal.     Lumbar back: She exhibits tenderness, bony tenderness, pain and spasm. She exhibits no swelling.  Skin:    Findings: No erythema or rash.  Neurological:     Mental Status: She is alert and oriented to  person, place, and time.    Lab Results  Component Value Date   WBC 9.2 03/17/2018   HGB 11.9 (L) 03/17/2018   HCT 35.7 (L) 03/17/2018   PLT 409.0 (H) 03/17/2018   GLUCOSE 99 03/17/2018   CHOL 150 03/17/2018   TRIG 150.0 (H) 03/17/2018   HDL 36.50 (L) 03/17/2018   LDLCALC 84 03/17/2018   ALT 14 03/17/2018   AST 12 03/17/2018   NA 136 03/17/2018   K 3.9 03/17/2018   CL 102 03/17/2018   CREATININE 0.49 03/17/2018   BUN 9 03/17/2018   CO2 29 03/17/2018   TSH 1.79 03/17/2018    Dg Chest 2 View  Result Date: 05/18/2013 CLINICAL DATA:  Preop evaluation, gastric sleeve procedure EXAM: CHEST  2 VIEW COMPARISON:   04/20/2012 FINDINGS: The heart size and mediastinal contours are within normal limits. Both lungs are clear. The visualized skeletal structures are unremarkable. IMPRESSION: No active cardiopulmonary disease. Electronically Signed   By: Margaree Mackintosh M.D.   On: 05/18/2013 10:25    Assessment & Plan:   [name] was seen today for back pain.  Diagnoses and all orders for this visit:  Strain of lumbar paraspinal muscle, initial encounter -     ketorolac (TORADOL) 30 MG/ML injection 30 mg -     predniSONE (STERAPRED UNI-PAK 21 TAB) 10 MG (21) TBPK tablet; As directed on package -     cyclobenzaprine (FLEXERIL) 5 MG tablet; Take 1-2 tablets (5-10 mg total) by mouth at bedtime.   I am having [name]. [name] start on predniSONE and cyclobenzaprine. I am also having her maintain her acetaminophen, multivitamin with minerals, albuterol, clotrimazole-betamethasone, lisinopril-hydrochlorothiazide, omeprazole, ALPRAZolam, aspirin-acetaminophen-caffeine, escitalopram, bismuth subsalicylate, and saccharomyces boulardii. We administered ketorolac.  Meds ordered this encounter  Medications  . ketorolac (TORADOL) 30 MG/ML injection 30 mg  . predniSONE (STERAPRED UNI-PAK 21 TAB) 10 MG (21) TBPK tablet    Sig: As directed on package    Dispense:  21 tablet    Refill:  0    Order Specific Question:   Supervising Provider    Answer:   Lucille Passy [[id number]]  . cyclobenzaprine (FLEXERIL) 5 MG tablet    Sig: Take 1-2 tablets (5-10 mg total) by mouth at bedtime.    Dispense:  14 tablet    Refill:  0    Order Specific Question:   Supervising Provider    Answer:   Lucille Passy [[id number]]    Problem List Items Addressed This Visit    None    Visit Diagnoses    Strain of lumbar paraspinal muscle, initial encounter    -  Primary   Relevant Medications   ketorolac (TORADOL) 30 MG/ML injection 30 mg (Completed)   predniSONE (STERAPRED UNI-PAK 21 TAB) 10 MG (21) TBPK tablet   cyclobenzaprine (FLEXERIL) 5 MG  tablet       Follow-up: Return if symptoms worsen or fail to improve.  Wilfred Lacy, NP

## 2018-07-28 NOTE — Patient Instructions (Signed)
Lumbosacral Strain  Lumbosacral strain is an injury that causes pain in the lower back (lumbosacral spine). This injury usually occurs from overstretching the muscles or ligaments along your spine. A strain can affect one or more muscles or cord-like tissues that connect bones to other bones (ligaments).  What are the causes?  This condition may be caused by:  · A hard, direct hit (blow) to the back.  · Excessive stretching of the lower back muscles. This may result from:  ? A fall.  ? Lifting something heavy.  ? Repetitive movements such as bending or crouching.  What increases the risk?  The following factors may increase your risk of getting this condition:  · Participating in sports or activities that involve:  ? A sudden twist of the back.  ? Pushing or pulling motions.  · Being overweight or obese.  · Having poor strength and flexibility, especially tight hamstrings or weak muscles in the back or abdomen.  · Having too much of a curve in the lower back.  · Having a pelvis that is tilted forward.  What are the signs or symptoms?  The main symptom of this condition is pain in the lower back, at the site of the strain. Pain may extend (radiate) down one or both legs.  How is this diagnosed?  This condition is diagnosed based on:  · Your symptoms.  · Your medical history.  · A physical exam.  ? Your health care provider may push on certain areas of your back to determine the source of your pain.  ? You may be asked to bend forward, backward, and side to side to assess the severity of your pain and your range of motion.  · Imaging tests, such as:  ? X-rays.  ? MRI.    How is this treated?  Treatment for this condition may include:  · Putting heat and cold on the affected area.  · Medicines to help relieve pain and relax your muscles (muscle relaxants).  · NSAIDs to help reduce swelling and discomfort.  When your symptoms improve, it is important to gradually return to your normal routine as soon as possible to  reduce pain, avoid stiffness, and avoid loss of muscle strength. Generally, symptoms should improve within 6 weeks of treatment. However, recovery time varies.  Follow these instructions at home:  Managing pain, stiffness, and swelling    · If directed, put ice on the injured area during the first 24 hours after your strain.  ? Put ice in a plastic bag.  ? Place a towel between your skin and the bag.  ? Leave the ice on for 20 minutes, 2-3 times a day.  · If directed, put heat on the affected area as often as told by your health care provider. Use the heat source that your health care provider recommends, such as a moist heat pack or a heating pad.  ? Place a towel between your skin and the heat source.  ? Leave the heat on for 20-30 minutes.  ? Remove the heat if your skin turns bright red. This is especially important if you are unable to feel pain, heat, or cold. You may have a greater risk of getting burned.  Activity  · Rest and return to your normal activities as told by your health care provider. Ask your health care provider what activities are safe for you.  · Avoid activities that take a lot of energy for as long as told by your   health care provider.  General instructions  · Take over-the-counter and prescription medicines only as told by your health care provider.  · Do not drive or use heavy machinery while taking prescription pain medicine.  · Do not use any products that contain nicotine or tobacco, such as cigarettes and e-cigarettes. If you need help quitting, ask your health care provider.  · Keep all follow-up visits as told by your health care provider. This is important.  How is this prevented?  · Use correct form when playing sports and lifting heavy objects.  · Use good posture when sitting and standing.  · Maintain a healthy weight.  · Sleep on a mattress with medium firmness to support your back.  · Be safe and responsible while being active to avoid falls.  · Do at least 150 minutes of  moderate-intensity exercise each week, such as brisk walking or water aerobics. Try a form of exercise that takes stress off your back, such as swimming or stationary cycling.  · Maintain physical fitness, including:  ? Strength.  ? Flexibility.  ? Cardiovascular fitness.  ? Endurance.  Contact a health care provider if:  · Your back pain does not improve after 6 weeks of treatment.  · Your symptoms get worse.  Get help right away if:  · Your back pain is severe.  · You cannot stand or walk.  · You have difficulty controlling when you urinate or when you have a bowel movement.  · You feel nauseous or you vomit.  · Your feet get very cold.  · You have numbness, tingling, weakness, or problems using your arms or legs.  · You develop any of the following:  ? Shortness of breath.  ? Dizziness.  ? Pain in your legs.  ? Weakness in your buttocks or legs.  ? Discoloration of the skin on your toes or legs.  This information is not intended to replace advice given to you by your health care provider. Make sure you discuss any questions you have with your health care provider.  Document Released: 03/26/2005 Document Revised: 01/04/2016 Document Reviewed: 11/18/2015  Elsevier Interactive Patient Education © 2019 [employer]

## 2018-08-09 ENCOUNTER — Ambulatory Visit: Payer: PRIVATE HEALTH INSURANCE | Admitting: Nurse Practitioner

## 2018-08-09 ENCOUNTER — Encounter: Payer: Self-pay | Admitting: Nurse Practitioner

## 2018-08-09 VITALS — BP 132/90 | HR 101 | Temp 98.2°F | Ht 64.5 in | Wt 303.2 lb

## 2018-08-09 DIAGNOSIS — J0140 Acute pansinusitis, unspecified: Secondary | ICD-10-CM

## 2018-08-09 DIAGNOSIS — J209 Acute bronchitis, unspecified: Secondary | ICD-10-CM | POA: Diagnosis not present

## 2018-08-09 MED ORDER — BENZONATATE 100 MG PO CAPS: 100.0000 mg | ORAL_CAPSULE | Freq: Three times a day (TID) | ORAL | 0 refills | Status: DC | PRN

## 2018-08-09 MED ORDER — AZITHROMYCIN 250 MG PO TABS: 250.0000 mg | ORAL_TABLET | Freq: Every day | ORAL | 0 refills | Status: DC

## 2018-08-09 MED ORDER — FLUTICASONE PROPIONATE 50 MCG/ACT NA SUSP: 2.0000 | Freq: Every day | NASAL | 0 refills | Status: AC

## 2018-08-09 MED ORDER — GUAIFENESIN ER 600 MG PO TB12: 600.0000 mg | ORAL_TABLET | Freq: Two times a day (BID) | ORAL | 0 refills | Status: DC | PRN

## 2018-08-09 MED FILL — BENZONATATE 100 MG CAP: 100 | 6 days supply | Qty: 20 | Fill #0

## 2018-08-09 MED FILL — FLUTICASONE PROP 50 MCG SPR: 50 | 30 days supply | Qty: 16 | Fill #0

## 2018-08-09 MED FILL — AZITHROMYCIN 250 MG TABLET: 250 | 5 days supply | Qty: 6 | Fill #0

## 2018-08-09 NOTE — Progress Notes (Signed)
Subjective:  Patient ID: [name], female    DOB: Jan 15, 1976  Age: 43 y.o. MRN: [id number]  CC: Cough (dry cough-- little bit of wheezing,sinus drainage, both ears hurt/ 3 days/ OTC cloritab)  Cough  This is a new problem. The current episode started in the past 7 days. The problem has been gradually worsening. The cough is productive of purulent sputum. Associated symptoms include chest pain, chills, ear congestion, ear pain, a fever, headaches, myalgias, nasal congestion, postnasal drip, rhinorrhea, a sore throat, shortness of breath, sweats and wheezing. Pertinent negatives include no heartburn or hemoptysis. The symptoms are aggravated by cold air and lying down. She has tried OTC cough suppressant and a beta-agonist inhaler for the symptoms. The treatment provided no relief. Her past medical history is significant for bronchitis and environmental allergies.   Reviewed past Medical, Social and Family history today.  Outpatient Medications Prior to Visit  Medication Sig Dispense Refill  . acetaminophen (TYLENOL) 500 MG tablet Take 1,000 mg by mouth every 6 (six) hours as needed. Pain    . albuterol (PROVENTIL HFA;VENTOLIN HFA) 108 (90 Base) MCG/ACT inhaler Inhale 2 puffs into the lungs every 6 (six) hours as needed for wheezing or shortness of breath. 1 Inhaler 0  . ALPRAZolam (XANAX) 0.25 MG tablet Take 1-2 tablets (0.25-0.5 mg total) by mouth daily as needed for anxiety. 10 tablet 0  . aspirin-acetaminophen-caffeine (EXCEDRIN MIGRAINE) 250-250-65 MG tablet Take 1 tablet by mouth every 6 (six) hours as needed for headache. 30 tablet 0  . bismuth subsalicylate (PEPTO-BISMOL) 262 MG/15ML suspension Take 30 mLs by mouth 4 (four) times daily -  before meals and at bedtime. 360 mL 0  . clotrimazole-betamethasone (LOTRISONE) cream Apply 1 application topically 2 (two) times daily. 30 g 0  . cyclobenzaprine (FLEXERIL) 5 MG tablet Take 1-2 tablets (5-10 mg total) by mouth at bedtime. (Patient  not taking: Reported on 08/09/2018) 14 tablet 0  . escitalopram (LEXAPRO) 20 MG tablet Take 1 tablet (20 mg total) by mouth daily. 30 tablet 0  . lisinopril-hydrochlorothiazide (PRINZIDE,ZESTORETIC) 10-12.5 MG tablet Take 1 tablet by mouth at bedtime. 90 tablet 3  . Multiple Vitamin (MULTIVITAMIN WITH MINERALS) TABS Take 1 tablet by mouth daily.    Marland Kitchen omeprazole (PRILOSEC) 20 MG capsule Take 1 capsule (20 mg total) by mouth daily. 90 capsule 1  . saccharomyces boulardii (FLORASTOR) 250 MG capsule Take 1 capsule (250 mg total) by mouth 2 (two) times daily. (Patient not taking: Reported on 08/09/2018)    . predniSONE (STERAPRED UNI-PAK 21 TAB) 10 MG (21) TBPK tablet As directed on package 21 tablet 0   No facility-administered medications prior to visit.     ROS See HPI  Objective:  BP 132/90   Pulse (!) 101   Temp 98.2 F (36.8 C) (Oral)   Ht 5' 4.5" (1.638 m)   Wt (!) 303 lb 3.2 oz (137.5 kg)   SpO2 95%   BMI 51.24 kg/m   BP Readings from Last 3 Encounters:  08/09/18 132/90  07/28/18 140/90  06/24/18 130/82    Wt Readings from Last 3 Encounters:  08/09/18 (!) 303 lb 3.2 oz (137.5 kg)  07/28/18 299 lb 12.8 oz (136 kg)  06/24/18 293 lb (132.9 kg)    Physical Exam Vitals signs reviewed.  HENT:     Head:     Jaw: No trismus.     Right Ear: Tympanic membrane, ear canal and external ear normal.     Left Ear:  Tympanic membrane, ear canal and external ear normal.     Nose: Mucosal edema and rhinorrhea present.     Right Sinus: Maxillary sinus tenderness present. No frontal sinus tenderness.     Left Sinus: Maxillary sinus tenderness present. No frontal sinus tenderness.     Mouth/Throat:     Pharynx: Uvula midline. Posterior oropharyngeal erythema present. No oropharyngeal exudate.  Eyes:     General: No scleral icterus. Neck:     Musculoskeletal: Normal range of motion and neck supple.  Cardiovascular:     Rate and Rhythm: Normal rate.     Pulses: Normal pulses.      Heart sounds: Normal heart sounds.  Pulmonary:     Effort: Pulmonary effort is normal.     Breath sounds: Normal breath sounds.  Lymphadenopathy:     Cervical: Cervical adenopathy present.  Neurological:     Mental Status: She is alert and oriented to person, place, and time.     Lab Results  Component Value Date   WBC 9.2 03/17/2018   HGB 11.9 (L) 03/17/2018   HCT 35.7 (L) 03/17/2018   PLT 409.0 (H) 03/17/2018   GLUCOSE 99 03/17/2018   CHOL 150 03/17/2018   TRIG 150.0 (H) 03/17/2018   HDL 36.50 (L) 03/17/2018   LDLCALC 84 03/17/2018   ALT 14 03/17/2018   AST 12 03/17/2018   NA 136 03/17/2018   K 3.9 03/17/2018   CL 102 03/17/2018   CREATININE 0.49 03/17/2018   BUN 9 03/17/2018   CO2 29 03/17/2018   TSH 1.79 03/17/2018    Dg Chest 2 View  Result Date: 05/18/2013 CLINICAL DATA:  Preop evaluation, gastric sleeve procedure EXAM: CHEST  2 VIEW COMPARISON:  04/20/2012 FINDINGS: The heart size and mediastinal contours are within normal limits. Both lungs are clear. The visualized skeletal structures are unremarkable. IMPRESSION: No active cardiopulmonary disease. Electronically Signed   By: Margaree Mackintosh M.D.   On: 05/18/2013 10:25    Assessment & Plan:   [name] was seen today for cough.  Diagnoses and all orders for this visit:  Acute non-recurrent pansinusitis -     azithromycin (ZITHROMAX Z-PAK) 250 MG tablet; Take 1 tablet (250 mg total) by mouth daily. Take 2tabs on first day, then 1tab once a day till complete -     guaiFENesin (MUCINEX) 600 MG 12 hr tablet; Take 1 tablet (600 mg total) by mouth 2 (two) times daily as needed for cough or to loosen phlegm. -     fluticasone (FLONASE) 50 MCG/ACT nasal spray; Place 2 sprays into both nostrils daily. -     benzonatate (TESSALON) 100 MG capsule; Take 1 capsule (100 mg total) by mouth 3 (three) times daily as needed for cough.  Acute bronchitis, unspecified organism -     azithromycin (ZITHROMAX Z-PAK) 250 MG tablet;  Take 1 tablet (250 mg total) by mouth daily. Take 2tabs on first day, then 1tab once a day till complete -     guaiFENesin (MUCINEX) 600 MG 12 hr tablet; Take 1 tablet (600 mg total) by mouth 2 (two) times daily as needed for cough or to loosen phlegm. -     fluticasone (FLONASE) 50 MCG/ACT nasal spray; Place 2 sprays into both nostrils daily. -     benzonatate (TESSALON) 100 MG capsule; Take 1 capsule (100 mg total) by mouth 3 (three) times daily as needed for cough.   I have discontinued Enda Santo. Larzelere's predniSONE. I am also having her start on  azithromycin, guaiFENesin, fluticasone, and benzonatate. Additionally, I am having her maintain her acetaminophen, multivitamin with minerals, albuterol, clotrimazole-betamethasone, lisinopril-hydrochlorothiazide, omeprazole, ALPRAZolam, aspirin-acetaminophen-caffeine, escitalopram, bismuth subsalicylate, saccharomyces boulardii, and cyclobenzaprine.  Meds ordered this encounter  Medications  . azithromycin (ZITHROMAX Z-PAK) 250 MG tablet    Sig: Take 1 tablet (250 mg total) by mouth daily. Take 2tabs on first day, then 1tab once a day till complete    Dispense:  6 tablet    Refill:  0    Order Specific Question:   Supervising Provider    Answer:   Lucille Passy [[id number]]  . guaiFENesin (MUCINEX) 600 MG 12 hr tablet    Sig: Take 1 tablet (600 mg total) by mouth 2 (two) times daily as needed for cough or to loosen phlegm.    Dispense:  14 tablet    Refill:  0    Order Specific Question:   Supervising Provider    Answer:   Lucille Passy [[id number]]  . fluticasone (FLONASE) 50 MCG/ACT nasal spray    Sig: Place 2 sprays into both nostrils daily.    Dispense:  16 g    Refill:  0    Order Specific Question:   Supervising Provider    Answer:   Lucille Passy [[id number]]  . benzonatate (TESSALON) 100 MG capsule    Sig: Take 1 capsule (100 mg total) by mouth 3 (three) times daily as needed for cough.    Dispense:  20 capsule    Refill:  0    Order Specific  Question:   Supervising Provider    Answer:   Lucille Passy [[id number]]    Problem List Items Addressed This Visit    None    Visit Diagnoses    Acute non-recurrent pansinusitis    -  Primary   Relevant Medications   azithromycin (ZITHROMAX Z-PAK) 250 MG tablet   guaiFENesin (MUCINEX) 600 MG 12 hr tablet   fluticasone (FLONASE) 50 MCG/ACT nasal spray   benzonatate (TESSALON) 100 MG capsule   Acute bronchitis, unspecified organism       Relevant Medications   azithromycin (ZITHROMAX Z-PAK) 250 MG tablet   guaiFENesin (MUCINEX) 600 MG 12 hr tablet   fluticasone (FLONASE) 50 MCG/ACT nasal spray   benzonatate (TESSALON) 100 MG capsule      Follow-up: No follow-ups on file.  Wilfred Lacy, NP

## 2018-08-09 NOTE — Patient Instructions (Signed)
Start albuterol 2times a day x 2days, then as needed.  Maintain adequate oral hydration.

## 2018-08-10 ENCOUNTER — Telehealth: Payer: Self-pay | Admitting: Nurse Practitioner

## 2018-08-10 DIAGNOSIS — J9801 Acute bronchospasm: Secondary | ICD-10-CM

## 2018-08-10 DIAGNOSIS — J0140 Acute pansinusitis, unspecified: Secondary | ICD-10-CM

## 2018-08-10 MED ORDER — ALBUTEROL SULFATE HFA 108 (90 BASE) MCG/ACT IN AERS: 2.0000 | INHALATION_SPRAY | Freq: Four times a day (QID) | RESPIRATORY_TRACT | 0 refills | Status: DC | PRN

## 2018-08-10 MED FILL — VENTOLIN HFA 90 MCG INHALER: 108 (90 BAS | 25 days supply | Qty: 18 | Fill #0

## 2018-08-10 NOTE — Telephone Encounter (Signed)
Copied from [place]. Topic: Quick Communication - Rx Refill/Question >> Aug 10, 2018  4:20 PM Wynetta Emery, [place] C wrote: Medication: albuterol (PROVENTIL HFA;VENTOLIN HFA) 108 (90 Base) MCG/ACT inhaler - pt was seen by provider and advised to continue using inhaler, pt says that she went to pharmacy and was told that a new Rx is needed.   Has the patient contacted their pharmacy? Yes  (Agent: If no, request that the patient contact the pharmacy for the refill.) (Agent: If yes, when and what did the pharmacy advise?)  Preferred Pharmacy (with phone number or street name): [facility], [place]  Agent: Please be advised that RX refills may take up to 3 business days. We ask that you follow-up with your pharmacy.

## 2018-08-10 NOTE — Telephone Encounter (Signed)
Pt is aware rx sent 

## 2018-08-11 MED ORDER — ALBUTEROL SULFATE HFA 108 (90 BASE) MCG/ACT IN AERS: 2.0000 | INHALATION_SPRAY | Freq: Four times a day (QID) | RESPIRATORY_TRACT | 0 refills | Status: AC | PRN

## 2018-08-11 NOTE — Telephone Encounter (Signed)
Received message from [place] for Ventolin HFA 90 mcg inhaler. Rx sent.

## 2018-08-11 NOTE — Addendum Note (Signed)
Addended byShawnie Pons on: 08/11/2018 01:25 PM   Modules accepted: Orders

## 2018-08-16 ENCOUNTER — Ambulatory Visit: Payer: PRIVATE HEALTH INSURANCE | Admitting: Family Medicine

## 2018-08-16 ENCOUNTER — Encounter: Payer: Self-pay | Admitting: Family Medicine

## 2018-08-16 ENCOUNTER — Ambulatory Visit (INDEPENDENT_AMBULATORY_CARE_PROVIDER_SITE_OTHER): Payer: PRIVATE HEALTH INSURANCE

## 2018-08-16 VITALS — BP 130/98 | HR 88 | Temp 98.6°F | Ht 64.5 in | Wt 298.2 lb

## 2018-08-16 DIAGNOSIS — R05 Cough: Secondary | ICD-10-CM

## 2018-08-16 DIAGNOSIS — R059 Cough, unspecified: Secondary | ICD-10-CM | POA: Insufficient documentation

## 2018-08-16 MED ORDER — METHYLPREDNISOLONE ACETATE 40 MG/ML IJ SUSP
40.0000 mg | Freq: Once | INTRAMUSCULAR | Status: AC
  Administered 2018-08-16: 40 mg via INTRAMUSCULAR

## 2018-08-16 NOTE — Progress Notes (Signed)
[name] - 43 y.o. female MRN [id number]  Date of birth: 05-Jan-1976  SUBJECTIVE:  Including CC & ROS.  Chief Complaint  Patient presents with  . Cough    cough-- brown mucous, congestion, wheezing/ finished z pack/ feels worse    [name] is a 43 y.o. female that is presenting with cough and mucus production.  Her symptoms been present for about a week.  She works as a Marine scientist at [employer].  She denies any fevers.  Feels like her symptoms are gotten worse again.  She has been treated with azithromycin and Mucinex.  Has had sick exposures.  Denies any bloody sputum production.    Review of Systems  Constitutional: Positive for activity change.  HENT: Positive for congestion.   Respiratory: Positive for cough.   Cardiovascular: Negative for chest pain.  Gastrointestinal: Negative for abdominal pain.    HISTORY: Past Medical, Surgical, Social, and Family History Reviewed & Updated per EMR.   Pertinent Historical Findings include:  Past Medical History:  Diagnosis Date  . Arthritis   . GERD (gastroesophageal reflux disease)   . Hypertension     Past Surgical History:  Procedure Laterality Date  . BREATH TEK H PYLORI N/A 03/04/2013   Procedure: BREATH TEK H PYLORI;  Surgeon: Madilyn Hook, DO;  Location: [facility];  Service: Endoscopy;  Laterality: N/A;  . CESAREAN SECTION  10/24/98,08/05/05,01/18/07,01/21/10   x4  . CHOLECYSTECTOMY  03/2007  . TONSILECTOMY/ADENOIDECTOMY WITH MYRINGOTOMY  1983  . TUBAL LIGATION      No Known Allergies  Family History  Problem Relation Age of Onset  . Heart disease Mother 48  . Arthritis Mother        OA  . Hyperlipidemia Mother   . Depression Mother   . Anxiety disorder Mother   . Mental illness Mother   . Suicidality Mother   . COPD Father   . Alcohol abuse Father   . Deep vein thrombosis Father   . Hyperlipidemia Father   . Sudden death Father 63       homicide  . Breast cancer Maternal Grandmother   .  Stroke Maternal Grandmother   . Deep vein thrombosis Maternal Grandmother   . Hypertension Maternal Grandmother   . Hyperlipidemia Maternal Grandmother   . Lung cancer Maternal Grandfather   . Hyperlipidemia Maternal Grandfather   . Hyperlipidemia Paternal Grandmother   . Hyperlipidemia Paternal Grandfather      Social History   Socioeconomic History  . Marital status: Married    Spouse name: Not on file  . Number of children: Not on file  . Years of education: Not on file  . Highest education level: Not on file  Occupational History  . Not on file  Social Needs  . Financial resource strain: Not on file  . Food insecurity:    Worry: Not on file    Inability: Not on file  . Transportation needs:    Medical: Not on file    Non-medical: Not on file  Tobacco Use  . Smoking status: Never Smoker  . Smokeless tobacco: Never Used  Substance and Sexual Activity  . Alcohol use: Yes    Comment: social  . Drug use: No  . Sexual activity: Yes    Birth control/protection: Surgical    Comment: tubal ligation  Lifestyle  . Physical activity:    Days per week: Not on file    Minutes per session: Not on file  . Stress: Not  on file  Relationships  . Social connections:    Talks on phone: Not on file    Gets together: Not on file    Attends religious service: Not on file    Active member of club or organization: Not on file    Attends meetings of clubs or organizations: Not on file    Relationship status: Not on file  . Intimate partner violence:    Fear of current or ex partner: Not on file    Emotionally abused: Not on file    Physically abused: Not on file    Forced sexual activity: Not on file  Other Topics Concern  . Not on file  Social History Narrative  . Not on file     PHYSICAL EXAM:  VS: BP (!) 130/98   Pulse 88   Temp 98.6 F (37 C) (Oral)   Ht 5' 4.5" (1.638 m)   Wt 298 lb 3.2 oz (135.3 kg)   SpO2 97%   BMI 50.40 kg/m  Physical Exam Gen: NAD, alert,  cooperative with exam, well-appearing ENT: normal lips, normal nasal mucosa, tympanic membranes clear and intact bilaterally, normal oropharynx, no cervical lymphadenopathy Eye: normal EOM, normal conjunctiva and lids CV:  no edema, +2 pedal pulses, regular rate and rhythm, S1-S2   Resp: no accessory muscle use, non-labored, clear to auscultation bilaterally, no crackles or wheezes Skin: no rashes, no areas of induration  Neuro: normal tone, normal sensation to touch Psych:  normal insight, alert and oriented MSK: Normal gait, normal strength       ASSESSMENT & PLAN:   Cough Possible to have had resolution of prior symptoms and became infected again. Could be post viral cough. Has been using inhaler and no wheezing today  - chest xray  - IM depo

## 2018-08-16 NOTE — Assessment & Plan Note (Signed)
Possible to have had resolution of prior symptoms and became infected again. Could be post viral cough. Has been using inhaler and no wheezing today  - chest xray  - IM depo

## 2018-08-16 NOTE — Patient Instructions (Signed)
Nice to meet you  Please try things such as zyrtec-D or allegra-D which is an antihistamine and decongestant.  Please try afrin which will help with nasal congestion but use for only three days.  Please also try using a netti pot on a regular occasion. Please try honey, vick's vapor rub, lozenges and humidifer for cough and sore throat  I will call you with the results from today  Please see [place] back if you're not improving.

## 2018-08-17 ENCOUNTER — Telehealth: Payer: Self-pay | Admitting: Family Medicine

## 2018-08-17 MED ORDER — LEVOFLOXACIN 750 MG PO TABS: 750.0000 mg | ORAL_TABLET | Freq: Every day | ORAL | 0 refills | Status: DC

## 2018-08-17 NOTE — Telephone Encounter (Signed)
Spoke with patient about xray results. Will send in levaquin.   Rosemarie Ax, MD [facility] 08/17/2018, 10:35 AM

## 2018-08-23 ENCOUNTER — Ambulatory Visit: Payer: PRIVATE HEALTH INSURANCE | Admitting: Physical Therapy

## 2018-09-08 ENCOUNTER — Encounter: Payer: Self-pay | Admitting: Physical Therapy

## 2018-09-08 ENCOUNTER — Ambulatory Visit: Payer: PRIVATE HEALTH INSURANCE | Attending: Family Medicine | Admitting: Physical Therapy

## 2018-09-08 ENCOUNTER — Other Ambulatory Visit: Payer: Self-pay

## 2018-09-08 DIAGNOSIS — M62830 Muscle spasm of back: Secondary | ICD-10-CM | POA: Insufficient documentation

## 2018-09-08 DIAGNOSIS — M545 Low back pain, unspecified: Secondary | ICD-10-CM

## 2018-09-08 NOTE — Therapy (Signed)
[address] [place], [place], [address] Phone: [phone]   Fax:  [phone]  Physical Therapy Evaluation  Patient Details  Name: [name] MRN: [id number] Date of Birth: Apr 13, 1976 Referring Provider (PT): Odis Luster   Encounter Date: 09/08/2018  PT End of Session - 09/08/18 1726    Visit Number  1    Number of Visits  12    Authorization Type  worker's comp    PT Start Time  5027    PT Stop Time  1735    PT Time Calculation (min)  45 min    Activity Tolerance  Patient tolerated treatment well    Behavior During Therapy  [facility] for tasks assessed/performed       Past Medical History:  Diagnosis Date  . Arthritis   . GERD (gastroesophageal reflux disease)   . Hypertension     Past Surgical History:  Procedure Laterality Date  . BREATH TEK H PYLORI N/A 03/04/2013   Procedure: BREATH TEK H PYLORI;  Surgeon: Madilyn Hook, DO;  Location: [facility];  Service: Endoscopy;  Laterality: N/A;  . CESAREAN SECTION  10/24/98,08/05/05,01/18/07,01/21/10   x4  . CHOLECYSTECTOMY  03/2007  . TONSILECTOMY/ADENOIDECTOMY WITH MYRINGOTOMY  1983  . TUBAL LIGATION      There were no vitals filed for this visit.   Subjective Assessment - 09/08/18 1658    Subjective  Patient works in the [employer], she was moving a patient, she was pushing the patient and felt a pop in the back, reports that night the pain got really bad to the point of having difficulty walking.  She reports that she has had prednisone and a steroid injection.  This occured at the end of January.  She does report that she got sick and was not moving much and that seems to make her tighter.    Limitations  Standing;Lifting    How long can you stand comfortably?  5-10 minutes    Patient Stated Goals  have less pain, go back to work    Currently in Pain?  Yes    Pain Score  1     Pain Location  Back    Pain Orientation  Lower    Pain Descriptors /  Indicators  Tightness;Sore    Pain Type  Acute pain    Pain Radiating Towards  denies    Pain Onset  More than a month ago    Pain Frequency  Intermittent    Aggravating Factors   standing, bending lifting, pushing pain up to 7/10    Pain Relieving Factors  lie down, sit down, ibuprofen pain can be 0/10    Effect of Pain on Daily Activities  difficulty with standing, out of work now         [facility] PT Assessment - 09/08/18 0001      Assessment   Medical Diagnosis  LBP    Referring Provider (PT)  Odis Luster    Onset Date/Surgical Date  07/28/18    Prior Therapy  no      Precautions   Precautions  None      Balance Screen   Has the patient fallen in the past 6 months  No    Has the patient had a decrease in activity level because of a fear of falling?   No    Is the patient reluctant to leave their home because of a fear of falling?   No  Home Environment   Additional Comments  stairs, does housework,       Prior Function   Level of Independence  Independent    Vocation  Full time employment    Vocation Requirements  OR nurse, lifting., pushing and pulling    Leisure  treadmill, elliptical      Posture/Postural Control   Posture Comments  fwd head, rounded shoulders      ROM / Strength   AROM / PROM / Strength  AROM;Strength      AROM   Overall AROM Comments  Lumbar ROM is decreased 50% with all motions increasing pain in the low back      Strength   Overall Strength Comments  LE strength 4-/5 with some low back pain      Flexibility   Soft Tissue Assessment /Muscle Length  yes    Hamstrings  tight    Piriformis  good      Palpation   Palpation comment  very sore, tender and tight in the lumbar parapsinals mms, she is very tender into the buttocks                Objective measurements completed on examination: See above findings.      [facility] Adult PT Treatment/Exercise - 09/08/18 0001      Modalities   Modalities  Electrical  Stimulation;Moist Research officer, political party Location  low back    Electrical Stimulation Action  IFC    Electrical Stimulation Parameters  sitting    Electrical Stimulation Goals  Pain               PT Short Term Goals - 09/08/18 1729      PT SHORT TERM GOAL #1   Title  independent with initial HEP    Time  2    Period  Weeks    Status  New        PT Long Term Goals - 09/08/18 1729      PT LONG TERM GOAL #1   Title  understand posture and body mechanics    Time  6    Period  Weeks    Status  New      PT LONG TERM GOAL #2   Title  decrease pain 50%    Time  6    Period  Weeks    Status  New      PT LONG TERM GOAL #3   Title  increase lumbar ROM 50%    Time  6    Period  Weeks    Status  New      PT LONG TERM GOAL #4   Title  able to tolerate standing without pain    Time  6    Period  Weeks    Status  New             Plan - 09/08/18 1727    Clinical Impression Statement  Patient reports at the end of January 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  She has had prednisone and injections, she is out of work now, she is limited 50% with her lumbar ROM, she has weakness due to pain in the back, she is very tight and tender  int eh lumbar mms.    Stability/Clinical Decision Making  Evolving/Moderate complexity    Clinical Decision Making  Low    Rehab Potential  Good    PT Frequency  2x / week    PT Duration  6 weeks    PT Treatment/Interventions  ADLs/Self Care Home Management;Cryotherapy;Electrical Stimulation;Moist Heat;Traction;Ultrasound;Therapeutic exercise;Therapeutic activities;Patient/family education;Manual techniques;Dry needling    PT Next Visit Plan  slowly start activity in the gym    Consulted and Agree with Plan of Care  Patient        Patient will benefit from skilled therapeutic intervention in order to improve the following deficits and impairments:  Improper body mechanics, Pain, Increased muscle spasms, Decreased range of motion, Decreased strength, Difficulty walking, Impaired vision/preception  Visit Diagnosis: Acute bilateral low back pain without sciatica - Plan: PT plan of care cert/re-cert  Muscle spasm of back - Plan: PT plan of care cert/re-cert     Problem List Patient Active Problem List   Diagnosis Date Noted  . Cough 08/16/2018  . GAD (generalized anxiety disorder) 06/19/2017  . S/P tubal ligation 02/24/2017  . Vitamin D deficiency 10/28/2016  . Magnesium deficiency 10/28/2016  . Depression 10/28/2016  . Insomnia 10/28/2016  . HTN (hypertension), benign 10/28/2016  . Gastroesophageal reflux disease with esophagitis 10/28/2016  . Status post dilation of esophageal narrowing 10/28/2016    Sumner Boast., PT 09/08/2018, 5:35 PM  [address] [place], [place], [address] Phone: [phone]   Fax:  [phone]  Name: [name] MRN: [id number] Date of Birth: [birth year]

## 2018-09-08 NOTE — Patient Instructions (Signed)
Access Code: [facility]  URL: [email or web address]  Date: 09/08/2018  Prepared by: Lum Babe   Exercises  Hooklying Single Knee to Chest - 10 reps - 1 sets - 10 hold - 2x daily - 7x weekly  Supine Double Knee to Chest - 10 reps - 1 sets - 10 hold - 2x daily - 7x weekly  Supine Lower Trunk Rotation - 10 reps - 1 sets - 10 hold - 2x daily - 7x weekly  Supine Posterior Pelvic Tilt - 5 reps - 1 sets - 5 hold - 2x daily - 7x weekly

## 2018-09-14 ENCOUNTER — Encounter: Payer: Self-pay | Admitting: Physical Therapy

## 2018-09-14 ENCOUNTER — Ambulatory Visit: Payer: PRIVATE HEALTH INSURANCE | Attending: Family Medicine | Admitting: Physical Therapy

## 2018-09-14 ENCOUNTER — Other Ambulatory Visit: Payer: Self-pay

## 2018-09-14 DIAGNOSIS — M62830 Muscle spasm of back: Secondary | ICD-10-CM | POA: Insufficient documentation

## 2018-09-14 DIAGNOSIS — M545 Low back pain, unspecified: Secondary | ICD-10-CM

## 2018-09-14 NOTE — Therapy (Addendum)
[address] [place], [place], [address] Phone: [phone]   Fax:  [phone]  Physical Therapy Treatment  Patient Details  Name: [name] MRN: [id number] Date of Birth: 1975-10-06 Referring Provider (PT): Odis Luster   Encounter Date: 09/14/2018  PT End of Session - 09/14/18 1711    Visit Number  2    Number of Visits  12    Authorization Type  worker's comp    PT Start Time  1700    PT Stop Time  1751    PT Time Calculation (min)  51 min    Activity Tolerance  Patient tolerated treatment well    Behavior During Therapy  [facility] for tasks assessed/performed       Past Medical History:  Diagnosis Date  . Arthritis   . GERD (gastroesophageal reflux disease)   . Hypertension     Past Surgical History:  Procedure Laterality Date  . BREATH TEK H PYLORI N/A 03/04/2013   Procedure: BREATH TEK H PYLORI;  Surgeon: Madilyn Hook, DO;  Location: [facility];  Service: Endoscopy;  Laterality: N/A;  . CESAREAN SECTION  10/24/98,08/05/05,01/18/07,01/21/10   x4  . CHOLECYSTECTOMY  03/2007  . TONSILECTOMY/ADENOIDECTOMY WITH MYRINGOTOMY  1983  . TUBAL LIGATION      There were no vitals filed for this visit.  Subjective Assessment - 09/14/18 1705    Subjective  Pt reports her back remains tight.  She thinks she feels a little looser with doing the stretches.     Currently in Pain?  Yes    Pain Score  3     Pain Location  Back    Pain Orientation  Lower    Pain Descriptors / Indicators  Tightness;Sore    Aggravating Factors   standing, bending, lifting, pushing     Pain Relieving Factors  ??         [facility] PT Assessment - 09/14/18 0001      Assessment   Medical Diagnosis  LBP    Referring Provider (PT)  Odis Luster    Onset Date/Surgical Date  07/28/18    Next MD Visit  09/17/18    Prior Therapy  no       [facility] Adult PT Treatment/Exercise - 09/14/18 0001      Self-Care   Self-Care  Other  Self-Care Comments    Other Self-Care Comments   Back care education initiated; discussed posture and core engagement to protect further injury.       Lumbar Exercises: Stretches   Passive Hamstring Stretch  Right;Left;2 reps;30 seconds   supine with strap   Single Knee to Chest Stretch  Right;Left;2 reps;20 seconds    Lower Trunk Rotation  2 reps;20 seconds   arms in T   Pelvic Tilt  2 reps    Piriformis Stretch  Right;Left;2 reps;30 seconds   seated with improved tolerance     Lumbar Exercises: Aerobic   Nustep  L4: legs only, 5 min       Lumbar Exercises: Seated   Sit to Stand  --   3 reps with ab set    Other Seated Lumbar Exercises  seated lap press (TA engagment) x 3 reps of 5 sec holds.     Other Seated Lumbar Exercises  Seated hamstring stretch x 1 rep x 15 sec       Lumbar Exercises: Supine   Ab Set  5 reps   tactile cues, 10 sec  holds   Bridge  10 reps   with ab set.     Modalities   Modalities  Electrical Stimulation;Cryotherapy      Cryotherapy   Number Minutes Cryotherapy  12 Minutes    Cryotherapy Location  Lumbar Spine    Type of Cryotherapy  Ice pack      Electrical Stimulation   Electrical Stimulation Location  bilat low back paraspinals    Electrical Stimulation Action  IFC    Electrical Stimulation Parameters  intensity to pt tolerance; pt in supported supine    Electrical Stimulation Goals  Pain       Additional stretches:  Seated forward flexion of trunk (since unable to perform double knee to chest) x 15 sec x 2;  Modified childs pose (seated hands on table, table slide forward...then with lateral trunk flexion to each side x 15 sec)      PT Education - 09/14/18 1749    Education Details  posture and body mechanics     Person(s) Educated  Patient    Methods  Explanation;Handout    Comprehension  Verbalized understanding       PT Short Term Goals - 09/08/18 1729      PT SHORT TERM GOAL #1   Title  independent with initial HEP    Time  2     Period  Weeks    Status  New        PT Long Term Goals - 09/08/18 1729      PT LONG TERM GOAL #1   Title  understand posture and body mechanics    Time  6    Period  Weeks    Status  New      PT LONG TERM GOAL #2   Title  decrease pain 50%    Time  6    Period  Weeks    Status  New      PT LONG TERM GOAL #3   Title  increase lumbar ROM 50%    Time  6    Period  Weeks    Status  New      PT LONG TERM GOAL #4   Title  able to tolerate standing without pain    Time  6    Period  Weeks    Status  New            Plan - 09/14/18 1740    Clinical Impression Statement  Pt tolerated all exercises well without increase in pain.  Some modifications made for comfort and ease of position.  Body mechanics with lifting/ work duties reviewed verbally (with understanding of core engagement).  Pt is progressing towards goals.     Rehab Potential  Good    PT Frequency  2x / week    PT Duration  6 weeks    PT Treatment/Interventions  ADLs/Self Care Home Management;Cryotherapy;Electrical Stimulation;Moist Heat;Traction;Ultrasound;Therapeutic exercise;Therapeutic activities;Patient/family education;Manual techniques;Dry needling    PT Next Visit Plan  continue back care education and core/spinal stabilization exercises.     Consulted and Agree with Plan of Care  Patient       Patient will benefit from skilled therapeutic intervention in order to improve the following deficits and impairments:  Improper body mechanics, Pain, Increased muscle spasms, Decreased range of motion, Decreased strength, Difficulty walking, Impaired vision/preception  Visit Diagnosis: Acute bilateral low back pain without sciatica  Muscle spasm of back     Problem List Patient Active Problem List  Diagnosis Date Noted  . Cough 08/16/2018  . GAD (generalized anxiety disorder) 06/19/2017  . S/P tubal ligation 02/24/2017  . Vitamin D deficiency 10/28/2016  . Magnesium deficiency 10/28/2016  .  Depression 10/28/2016  . Insomnia 10/28/2016  . HTN (hypertension), benign 10/28/2016  . Gastroesophageal reflux disease with esophagitis 10/28/2016  . Status post dilation of esophageal narrowing 10/28/2016   Kerin Perna, PTA 09/14/18 5:50 PM  [address] [place], [place], [address] Phone: [phone]   Fax:  [phone]  Name: [name] MRN: [id number] Date of Birth: 1976/01/05

## 2018-09-14 NOTE — Patient Instructions (Signed)

## 2018-09-17 ENCOUNTER — Ambulatory Visit: Payer: PRIVATE HEALTH INSURANCE | Admitting: Physical Therapy

## 2018-09-20 ENCOUNTER — Ambulatory Visit: Payer: PRIVATE HEALTH INSURANCE | Admitting: Physical Therapy

## 2018-09-22 ENCOUNTER — Encounter: Payer: Self-pay | Admitting: Physical Therapy

## 2018-09-27 ENCOUNTER — Encounter: Payer: Self-pay | Admitting: Physical Therapy

## 2018-09-29 ENCOUNTER — Encounter: Payer: Self-pay | Admitting: Physical Therapy

## 2018-10-04 ENCOUNTER — Encounter: Payer: Self-pay | Admitting: Physical Therapy

## 2018-10-06 ENCOUNTER — Other Ambulatory Visit: Payer: Self-pay | Admitting: Nurse Practitioner

## 2018-10-06 ENCOUNTER — Encounter: Payer: Self-pay | Admitting: Physical Therapy

## 2018-10-07 NOTE — Telephone Encounter (Signed)
Need to schedule virtual visit

## 2018-10-07 NOTE — Telephone Encounter (Signed)
Pt requesting Rx refill

## 2018-10-11 ENCOUNTER — Encounter: Payer: Self-pay | Admitting: Physical Therapy

## 2018-10-11 ENCOUNTER — Ambulatory Visit (INDEPENDENT_AMBULATORY_CARE_PROVIDER_SITE_OTHER): Payer: No Typology Code available for payment source | Admitting: Nurse Practitioner

## 2018-10-11 ENCOUNTER — Encounter: Payer: Self-pay | Admitting: Nurse Practitioner

## 2018-10-11 VITALS — HR 88 | Ht 64.5 in | Wt 300.0 lb

## 2018-10-11 DIAGNOSIS — F3341 Major depressive disorder, recurrent, in partial remission: Secondary | ICD-10-CM | POA: Diagnosis not present

## 2018-10-11 DIAGNOSIS — I10 Essential (primary) hypertension: Secondary | ICD-10-CM

## 2018-10-11 DIAGNOSIS — F411 Generalized anxiety disorder: Secondary | ICD-10-CM

## 2018-10-11 MED ORDER — ESCITALOPRAM OXALATE 20 MG PO TABS: 20.0000 mg | ORAL_TABLET | Freq: Every day | ORAL | 3 refills | Status: DC

## 2018-10-11 MED ORDER — ALPRAZOLAM 0.25 MG PO TABS: 0.2500 mg | ORAL_TABLET | Freq: Every day | ORAL | 0 refills | Status: DC | PRN

## 2018-10-11 NOTE — Patient Instructions (Signed)
Continue current medications. Send BP readings through [employer] in next week. F/up in office in 1months for CPE (fasting)

## 2018-10-11 NOTE — Assessment & Plan Note (Signed)
stable with lisinopril/HCTZ She is to send BP readings via mychart within this week.  Need to repeat BMP during next OV in 65months

## 2018-10-11 NOTE — Assessment & Plan Note (Signed)
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 Continue lexapro. F/up in 107months.

## 2018-10-11 NOTE — Assessment & Plan Note (Signed)
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 Continue lexapro. F/up in 60months.

## 2018-10-11 NOTE — Progress Notes (Signed)
Virtual Visit via Video Note  I connected with [name] on 10/11/18 at  2:30 PM EDT by a video enabled telemedicine application and verified that I am speaking with the correct person using two identifiers.   I discussed the limitations of evaluation and management by telemedicine and the availability of in person appointments. The patient expressed understanding and agreed to proceed.   CC: HTN and Anxiety.  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 Current use of lexapro 20mg  Depression screen [facility] 2/9 10/11/2018 03/17/2018 06/19/2017  Decreased Interest 1 0 2  Down, Depressed, Hopeless 1 1 3   PHQ - 2 Score 2 1 5   Altered sleeping 1 0 2  Tired, decreased energy 3 3 3   Change in appetite 1 1 3   Feeling bad or failure about yourself  1 0 2  Trouble concentrating 1 0 2  Moving slowly or fidgety/restless 0 0 2  Suicidal thoughts 0 0 0  PHQ-9 Score 9 5 19   Difficult doing work/chores - - -   GAD 7 : Generalized Anxiety Score 10/11/2018 03/17/2018 06/19/2017  Nervous, Anxious, on Edge 3 1 3   Control/stop worrying 0 0 3  Worry too much - different things 1 1 3   Trouble relaxing 1 0 3  Restless 0 0 2  Easily annoyed or irritable 1 0 3  Afraid - awful might happen 0 0 3  Total GAD 7 Score 6 2 20    Observations/Objective: No vital signs to provided Physical Exam  Constitutional: She is oriented to person, place, and time.  Neck: Normal range of motion. Neck supple.  Pulmonary/Chest: Effort normal.  Neurological: She is alert and oriented to person, place, and time.  Psychiatric: She has a normal mood and affect. Her behavior is normal. Thought content normal.  Vitals reviewed.  Assessment and Plan: [name] was seen today for medication refill.  Diagnoses and all orders for  this visit:  GAD (generalized anxiety disorder) -     escitalopram (LEXAPRO) 20 MG tablet; Take 1 tablet (20 mg total) by mouth daily. -     ALPRAZolam (XANAX) 0.25 MG tablet; Take 1-2 tablets (0.25-0.5 mg total) by mouth daily as needed for anxiety.  Recurrent major depressive disorder, in partial remission (HCC) -     escitalopram (LEXAPRO) 20 MG tablet; Take 1 tablet (20 mg total) by mouth daily. -     ALPRAZolam (XANAX) 0.25 MG tablet; Take 1-2 tablets (0.25-0.5 mg total) by mouth daily as needed for anxiety.  HTN (hypertension), benign    Follow Up Instructions: Continue current medications. Send BP readings through [employer] in next week. F/up in office in 20months for CPE (fasting)   I discussed the assessment and treatment plan with the patient. The patient was provided an opportunity to ask questions and all were answered. The patient agreed with the plan and demonstrated an understanding of the instructions.   The patient was advised to call back or seek an in-person evaluation if the symptoms worsen or if the condition fails to improve as anticipated.   Wilfred Lacy, NP

## 2019-01-28 ENCOUNTER — Encounter: Payer: Self-pay | Admitting: Family Medicine

## 2019-01-28 ENCOUNTER — Telehealth (INDEPENDENT_AMBULATORY_CARE_PROVIDER_SITE_OTHER): Payer: PRIVATE HEALTH INSURANCE | Admitting: Family Medicine

## 2019-01-28 DIAGNOSIS — K529 Noninfective gastroenteritis and colitis, unspecified: Secondary | ICD-10-CM | POA: Diagnosis not present

## 2019-01-28 MED ORDER — DICYCLOMINE HCL 10 MG PO CAPS: 10.0000 mg | ORAL_CAPSULE | Freq: Three times a day (TID) | ORAL | 0 refills | Status: AC

## 2019-01-28 MED ORDER — ONDANSETRON 4 MG PO TBDP: 4.0000 mg | ORAL_TABLET | Freq: Three times a day (TID) | ORAL | 0 refills | Status: AC | PRN

## 2019-01-28 NOTE — Progress Notes (Signed)
[name] - 43 y.o. female MRN [id number]  Date of birth: June 28, 1976   This visit type was conducted due to national recommendations for restrictions regarding the COVID-19 Pandemic (e.g. social distancing).  This format is felt to be most appropriate for this patient at this time.  All issues noted in this document were discussed and addressed.  No physical exam was performed (except for noted visual exam findings with Video Visits).  I discussed the limitations of evaluation and management by telemedicine and the availability of in person appointments. The patient expressed understanding and agreed to proceed.  I connected with@ on 01/28/19 at  3:30 PM EDT by a video enabled telemedicine application and verified that I am speaking with the correct person using two identifiers.   Patient Location: Home [address] [place] [address]   Provider location:   Home office  Chief Complaint  Patient presents with  . Abdominal Pain    pt states she's having really bad stomach cramps/ diarrhea (mostly water) since about 930 last night/ has used bathroom around 50 times/ hot flashes and cold chills    HPI  [name] is a 43 y.o. female who presents via audio/video conferencing for a telehealth visit today.  She has complaint of stomach cramps with diarrhea and nausea.  She reports that symptoms began last night. Has had diarrhea 30-40x since last evening.  Had a pizza party at work but denies any other foods that would contribute to this. She has had her gallbladder removed.  She denies blood in her stool or dark/tarry stools.  She has some anal irritation from the frequency of diarrhea.  She is drinking a good amount of fluid but is not eating anything right now.  She has had some chills but denies fever.  She has not had any other symptoms that would be consistent with COVID like illness including any respiratory symptoms, body aches or headache.      ROS:  A  comprehensive ROS was completed and negative except as noted per HPI  Past Medical History:  Diagnosis Date  . Arthritis   . GERD (gastroesophageal reflux disease)   . Hypertension     Past Surgical History:  Procedure Laterality Date  . BREATH TEK H PYLORI N/A 03/04/2013   Procedure: BREATH TEK H PYLORI;  Surgeon: Madilyn Hook, DO;  Location: [facility];  Service: Endoscopy;  Laterality: N/A;  . CESAREAN SECTION  10/24/98,08/05/05,01/18/07,01/21/10   x4  . CHOLECYSTECTOMY  03/2007  . TONSILECTOMY/ADENOIDECTOMY WITH MYRINGOTOMY  1983  . TUBAL LIGATION      Family History  Problem Relation Age of Onset  . Heart disease Mother 24  . Arthritis Mother        OA  . Hyperlipidemia Mother   . Depression Mother   . Anxiety disorder Mother   . Mental illness Mother   . Suicidality Mother   . COPD Father   . Alcohol abuse Father   . Deep vein thrombosis Father   . Hyperlipidemia Father   . Sudden death Father 69       homicide  . Breast cancer Maternal Grandmother   . Stroke Maternal Grandmother   . Deep vein thrombosis Maternal Grandmother   . Hypertension Maternal Grandmother   . Hyperlipidemia Maternal Grandmother   . Lung cancer Maternal Grandfather   . Hyperlipidemia Maternal Grandfather   . Hyperlipidemia Paternal Grandmother   . Hyperlipidemia Paternal Grandfather     Social History  Socioeconomic History  . Marital status: Married    Spouse name: Not on file  . Number of children: Not on file  . Years of education: Not on file  . Highest education level: Not on file  Occupational History  . Not on file  Social Needs  . Financial resource strain: Not on file  . Food insecurity    Worry: Not on file    Inability: Not on file  . Transportation needs    Medical: Not on file    Non-medical: Not on file  Tobacco Use  . Smoking status: Never Smoker  . Smokeless tobacco: Never Used  Substance and Sexual Activity  . Alcohol use: Yes    Comment: social  . Drug  use: No  . Sexual activity: Yes    Birth control/protection: Surgical    Comment: tubal ligation  Lifestyle  . Physical activity    Days per week: Not on file    Minutes per session: Not on file  . Stress: Not on file  Relationships  . Social Herbalist on phone: Not on file    Gets together: Not on file    Attends religious service: Not on file    Active member of club or organization: Not on file    Attends meetings of clubs or organizations: Not on file    Relationship status: Not on file  . Intimate partner violence    Fear of current or ex partner: Not on file    Emotionally abused: Not on file    Physically abused: Not on file    Forced sexual activity: Not on file  Other Topics Concern  . Not on file  Social History Narrative  . Not on file     Current Outpatient Medications:  .  acetaminophen (TYLENOL) 500 MG tablet, Take 1,000 mg by mouth every 6 (six) hours as needed. Pain, Disp: , Rfl:  .  albuterol (PROVENTIL HFA) 108 (90 Base) MCG/ACT inhaler, Inhale 2 puffs into the lungs every 6 (six) hours as needed for wheezing or shortness of breath., Disp: 1 Inhaler, Rfl: 0 .  escitalopram (LEXAPRO) 20 MG tablet, Take 1 tablet (20 mg total) by mouth daily., Disp: 90 tablet, Rfl: 3 .  lisinopril-hydrochlorothiazide (PRINZIDE,ZESTORETIC) 10-12.5 MG tablet, Take 1 tablet by mouth at bedtime., Disp: 90 tablet, Rfl: 3 .  Multiple Vitamin (MULTIVITAMIN WITH MINERALS) TABS, Take 1 tablet by mouth daily., Disp: , Rfl:  .  omeprazole (PRILOSEC) 20 MG capsule, Take 1 capsule (20 mg total) by mouth daily., Disp: 90 capsule, Rfl: 1 .  ALPRAZolam (XANAX) 0.25 MG tablet, Take 1-2 tablets (0.25-0.5 mg total) by mouth daily as needed for anxiety. (Patient not taking: Reported on 01/28/2019), Disp: 10 tablet, Rfl: 0 .  clotrimazole-betamethasone (LOTRISONE) cream, Apply 1 application topically 2 (two) times daily. (Patient not taking: Reported on 01/28/2019), Disp: 30 g, Rfl: 0 .   fluticasone (FLONASE) 50 MCG/ACT nasal spray, Place 2 sprays into both nostrils daily. (Patient not taking: Reported on 01/28/2019), Disp: 16 g, Rfl: 0  EXAM:  VITALS per patient if applicable: There were no vitals taken for this visit.  GENERAL: appears ill but non-toxic.    HEENT: atraumatic, conjunttiva clear, no obvious abnormalities on inspection of external nose and ears  NECK: normal movements of the head and neck  LUNGS: on inspection no signs of respiratory distress, breathing rate appears normal, no obvious gross SOB, gasping or wheezing  CV: no obvious cyanosis  MS: moves all visible extremities without noticeable abnormality  PSYCH/NEURO: pleasant and cooperative, no obvious depression or anxiety, speech and thought processing grossly intact  ASSESSMENT AND PLAN:  Discussed the following assessment and plan:  Gastroenteritis -Likely viral gastroenteritis however with frequency of stools bacterial causes are also in the differential.  -She does not want to take immodium at this time as she wants her body to "get rid of it".  Discussed with frequency of stools she currently has she could try this or pepto bismol to slow this down.  I have also sent in bentyl to help with abdominal cramping and zofran for nausea.  -She should continue to push fluids -If worsening or develops s/s of dehydration she should be seen in the ED.   -If continues into next week with we can consider stool culture.         I discussed the assessment and treatment plan with the patient. The patient was provided an opportunity to ask questions and all were answered. The patient agreed with the plan and demonstrated an understanding of the instructions.   The patient was advised to call back or seek an in-person evaluation if the symptoms worsen or if the condition fails to improve as anticipated.    Luetta Nutting, DO

## 2019-01-28 NOTE — Assessment & Plan Note (Signed)
-  Likely viral gastroenteritis however with frequency of stools bacterial causes are also in the differential.  -She does not want to take immodium at this time as she wants her body to "get rid of it".  Discussed with frequency of stools she currently has she could try this or pepto bismol to slow this down.  I have also sent in bentyl to help with abdominal cramping and zofran for nausea.  -She should continue to push fluids -If worsening or develops s/s of dehydration she should be seen in the ED.   -If continues into next week with we can consider stool culture.

## 2019-02-25 ENCOUNTER — Other Ambulatory Visit: Payer: Self-pay | Admitting: Nurse Practitioner

## 2019-02-25 ENCOUNTER — Encounter: Payer: Self-pay | Admitting: Nurse Practitioner

## 2019-02-25 ENCOUNTER — Other Ambulatory Visit: Payer: Self-pay | Admitting: Family Medicine

## 2019-02-25 DIAGNOSIS — F411 Generalized anxiety disorder: Secondary | ICD-10-CM

## 2019-02-25 DIAGNOSIS — F3341 Major depressive disorder, recurrent, in partial remission: Secondary | ICD-10-CM

## 2019-02-25 MED ORDER — ALPRAZOLAM 0.25 MG PO TABS: 0.2500 mg | ORAL_TABLET | Freq: Every day | ORAL | 0 refills | Status: AC | PRN

## 2019-02-25 NOTE — Telephone Encounter (Signed)
PDMP reviewed.  #10 sent in.

## 2019-03-03 ENCOUNTER — Other Ambulatory Visit: Payer: Self-pay | Admitting: Nurse Practitioner

## 2019-03-03 DIAGNOSIS — K2100 Gastro-esophageal reflux disease with esophagitis, without bleeding: Secondary | ICD-10-CM

## 2019-05-04 ENCOUNTER — Ambulatory Visit: Payer: PRIVATE HEALTH INSURANCE | Admitting: Nurse Practitioner

## 2019-05-13 ENCOUNTER — Ambulatory Visit: Payer: PRIVATE HEALTH INSURANCE | Admitting: Nurse Practitioner

## 2019-06-01 ENCOUNTER — Other Ambulatory Visit: Payer: Self-pay | Admitting: Nurse Practitioner

## 2019-06-01 DIAGNOSIS — I10 Essential (primary) hypertension: Secondary | ICD-10-CM

## 2019-06-01 DIAGNOSIS — F3341 Major depressive disorder, recurrent, in partial remission: Secondary | ICD-10-CM

## 2019-06-01 DIAGNOSIS — K2100 Gastro-esophageal reflux disease with esophagitis, without bleeding: Secondary | ICD-10-CM

## 2019-06-01 DIAGNOSIS — F411 Generalized anxiety disorder: Secondary | ICD-10-CM

## 2019-08-08 ENCOUNTER — Other Ambulatory Visit: Payer: Self-pay | Admitting: Nurse Practitioner

## 2019-08-08 DIAGNOSIS — I10 Essential (primary) hypertension: Secondary | ICD-10-CM

## 2019-08-08 DIAGNOSIS — F411 Generalized anxiety disorder: Secondary | ICD-10-CM

## 2020-08-22 IMAGING — DX DG CHEST 2V
2 series · 2 of 2 positions shown · non-contrast
Comparison: 05/18/2013

CLINICAL DATA: Cough and mucus production.

EXAM:
CHEST - 2 VIEW

[chest pa]
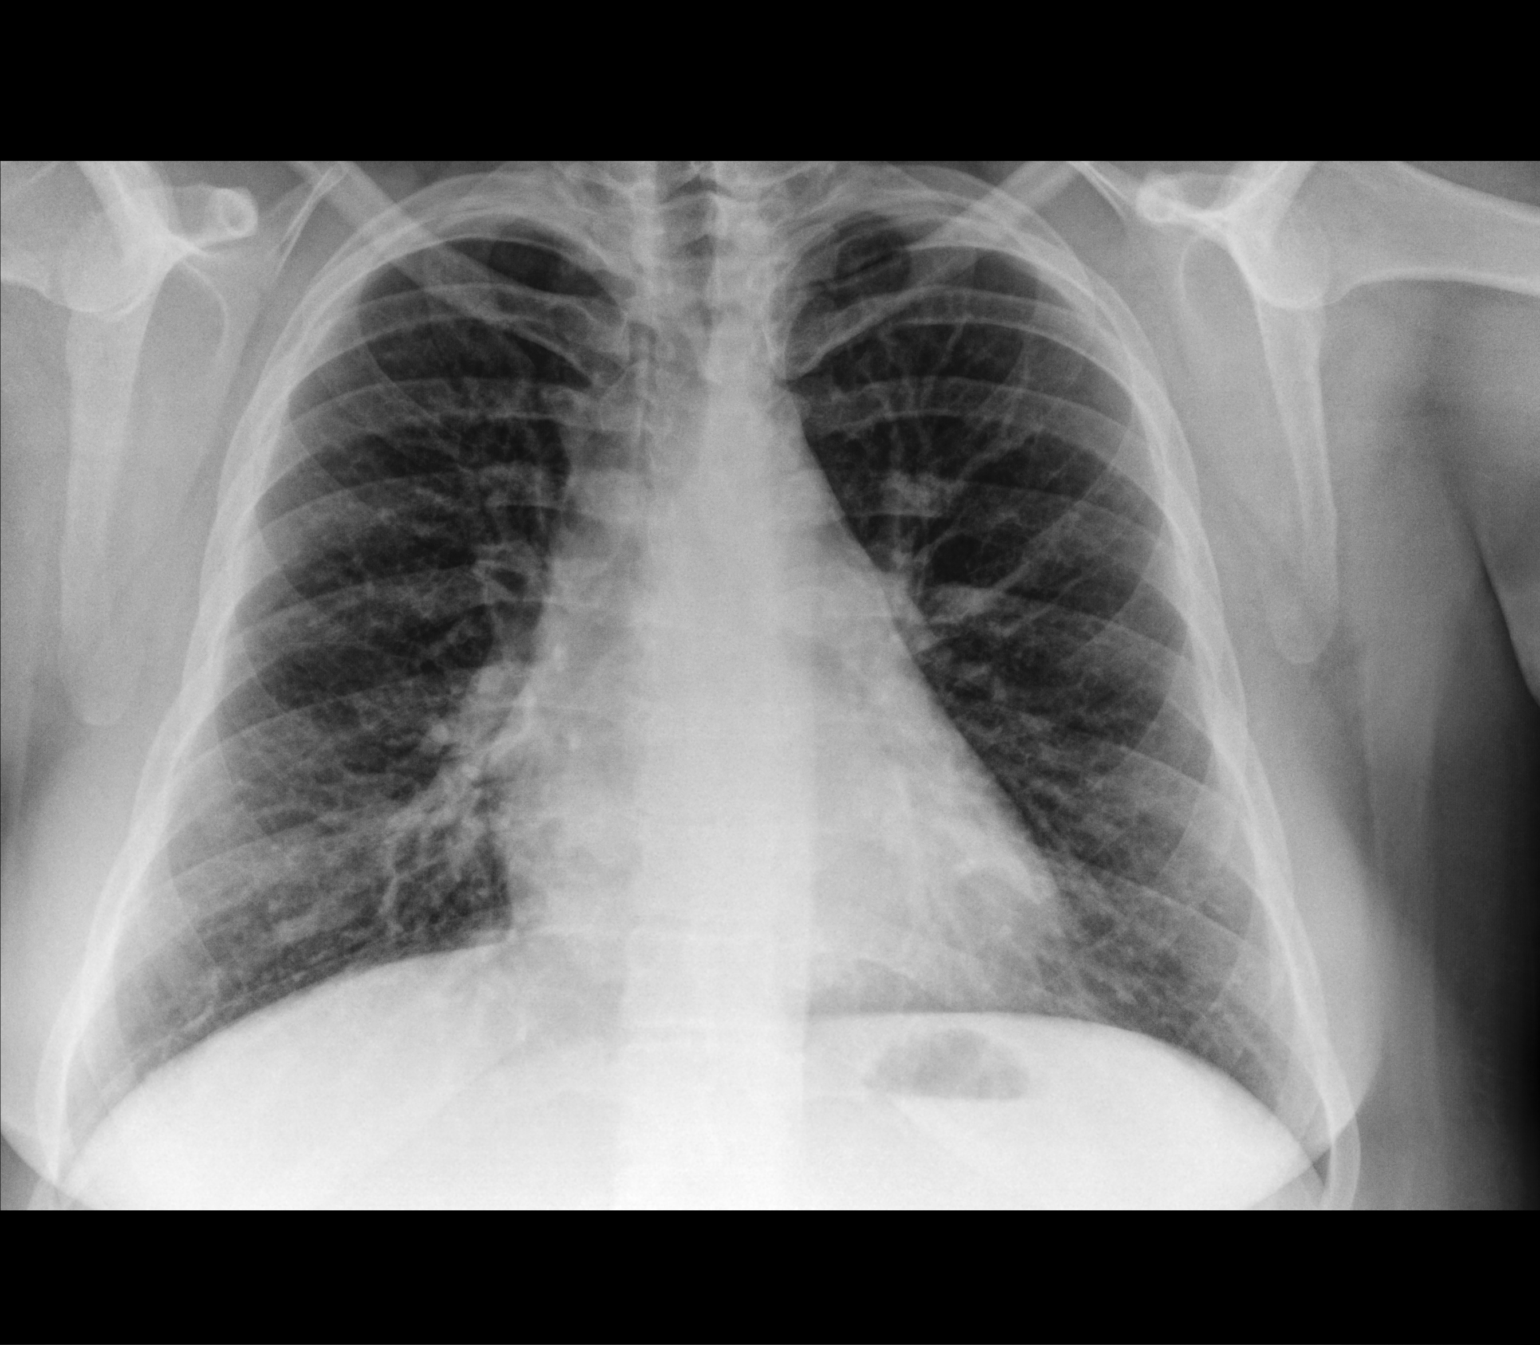

[chest lat]
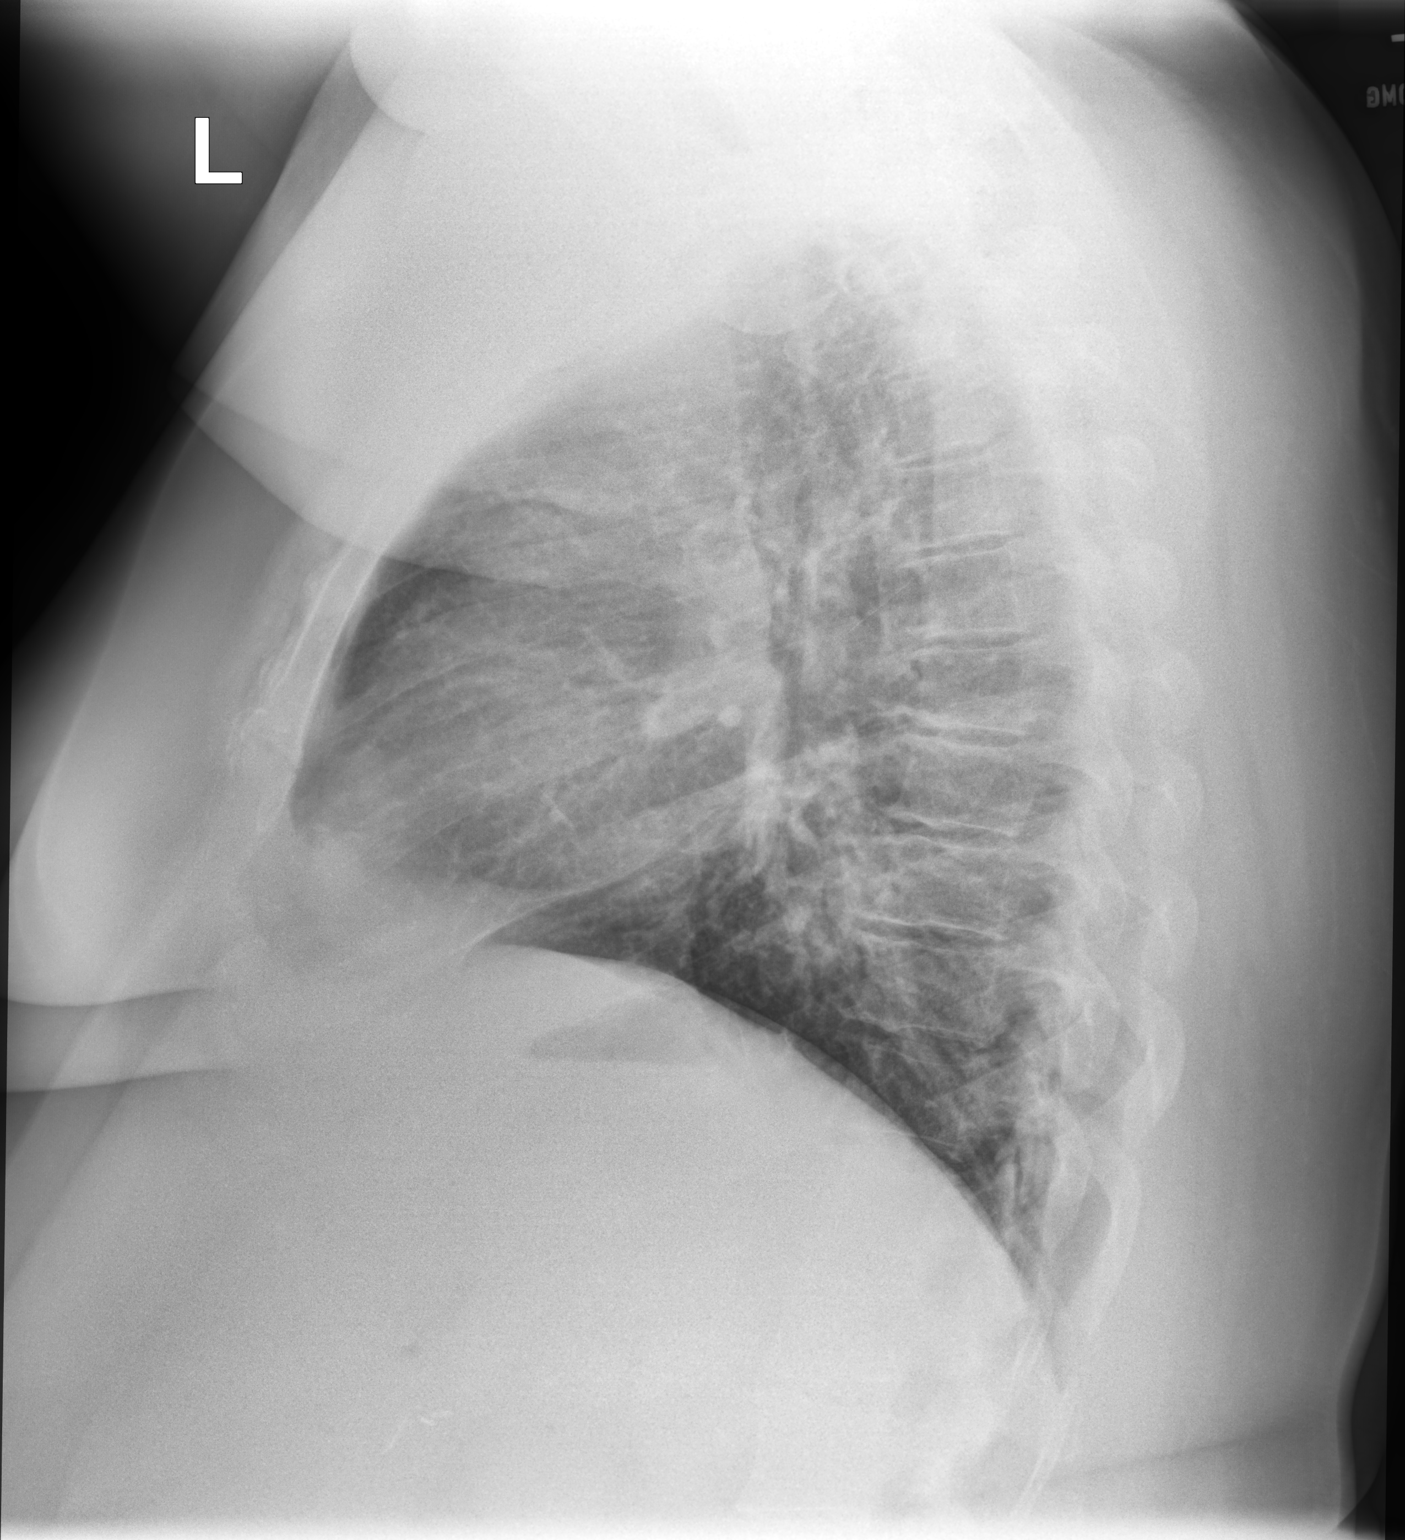

[2 of 2 positions shown; findings below may reference images not displayed]

FINDINGS: Normal heart size. No pleural effusion or edema identified. Airspace
density within the anterior right middle lobe is identified on the
lateral radiograph concerning for pneumonia. The remaining portions
of the lungs are clear.
IMPRESSION: 1. Suspect right middle lobe pneumonia.
